# Patient Record
Sex: Male | Born: 1960 | Race: White | Hispanic: No
Health system: Southern US, Community
[De-identification: ages and names within clinical notes are randomized; demographics above are authoritative.]

---

## 1997-08-17 ENCOUNTER — Ambulatory Visit (HOSPITAL_BASED_OUTPATIENT_CLINIC_OR_DEPARTMENT_OTHER): Admission: RE | Admit: 1997-08-17 | Discharge: 1997-08-17 | Payer: Self-pay | Admitting: *Deleted

## 2004-02-22 ENCOUNTER — Ambulatory Visit: Payer: Self-pay | Admitting: Internal Medicine

## 2004-02-22 ENCOUNTER — Inpatient Hospital Stay (HOSPITAL_COMMUNITY): Admission: AD | Admit: 2004-02-22 | Discharge: 2004-03-07 | Payer: Self-pay | Admitting: Internal Medicine

## 2011-11-24 ENCOUNTER — Other Ambulatory Visit: Payer: Self-pay | Admitting: Family Medicine

## 2012-01-24 ENCOUNTER — Other Ambulatory Visit: Payer: Self-pay | Admitting: Family Medicine

## 2012-02-02 ENCOUNTER — Other Ambulatory Visit: Payer: Self-pay | Admitting: Family Medicine

## 2012-02-06 ENCOUNTER — Other Ambulatory Visit: Payer: Self-pay | Admitting: Family Medicine

## 2012-06-17 ENCOUNTER — Other Ambulatory Visit: Payer: Self-pay | Admitting: Family Medicine

## 2012-06-19 ENCOUNTER — Other Ambulatory Visit: Payer: Self-pay | Admitting: Family Medicine

## 2014-02-09 DIAGNOSIS — Z7901 Long term (current) use of anticoagulants: Secondary | ICD-10-CM | POA: Diagnosis not present

## 2014-02-23 DIAGNOSIS — Z7901 Long term (current) use of anticoagulants: Secondary | ICD-10-CM | POA: Diagnosis not present

## 2014-03-09 DIAGNOSIS — Z7901 Long term (current) use of anticoagulants: Secondary | ICD-10-CM | POA: Diagnosis not present

## 2014-03-23 DIAGNOSIS — Z791 Long term (current) use of non-steroidal anti-inflammatories (NSAID): Secondary | ICD-10-CM | POA: Diagnosis not present

## 2014-04-06 DIAGNOSIS — Z7901 Long term (current) use of anticoagulants: Secondary | ICD-10-CM | POA: Diagnosis not present

## 2014-04-27 DIAGNOSIS — I480 Paroxysmal atrial fibrillation: Secondary | ICD-10-CM | POA: Diagnosis not present

## 2014-05-11 DIAGNOSIS — Z7901 Long term (current) use of anticoagulants: Secondary | ICD-10-CM | POA: Diagnosis not present

## 2014-05-25 DIAGNOSIS — I480 Paroxysmal atrial fibrillation: Secondary | ICD-10-CM | POA: Diagnosis not present

## 2014-05-25 DIAGNOSIS — Z7901 Long term (current) use of anticoagulants: Secondary | ICD-10-CM | POA: Diagnosis not present

## 2014-06-22 DIAGNOSIS — I4891 Unspecified atrial fibrillation: Secondary | ICD-10-CM | POA: Diagnosis not present

## 2014-06-22 DIAGNOSIS — Z7901 Long term (current) use of anticoagulants: Secondary | ICD-10-CM | POA: Diagnosis not present

## 2014-07-20 DIAGNOSIS — I4891 Unspecified atrial fibrillation: Secondary | ICD-10-CM | POA: Diagnosis not present

## 2014-07-20 DIAGNOSIS — Z7901 Long term (current) use of anticoagulants: Secondary | ICD-10-CM | POA: Diagnosis not present

## 2014-08-17 DIAGNOSIS — Z7901 Long term (current) use of anticoagulants: Secondary | ICD-10-CM | POA: Diagnosis not present

## 2014-08-17 DIAGNOSIS — I4891 Unspecified atrial fibrillation: Secondary | ICD-10-CM | POA: Diagnosis not present

## 2014-09-14 DIAGNOSIS — I4891 Unspecified atrial fibrillation: Secondary | ICD-10-CM | POA: Diagnosis not present

## 2014-09-14 DIAGNOSIS — Z7901 Long term (current) use of anticoagulants: Secondary | ICD-10-CM | POA: Diagnosis not present

## 2014-10-16 DIAGNOSIS — I4891 Unspecified atrial fibrillation: Secondary | ICD-10-CM | POA: Diagnosis not present

## 2014-10-16 DIAGNOSIS — Z7901 Long term (current) use of anticoagulants: Secondary | ICD-10-CM | POA: Diagnosis not present

## 2014-10-23 DIAGNOSIS — I482 Chronic atrial fibrillation: Secondary | ICD-10-CM | POA: Diagnosis not present

## 2014-10-23 DIAGNOSIS — Z7901 Long term (current) use of anticoagulants: Secondary | ICD-10-CM | POA: Diagnosis not present

## 2014-11-06 DIAGNOSIS — Z7901 Long term (current) use of anticoagulants: Secondary | ICD-10-CM | POA: Diagnosis not present

## 2014-11-06 DIAGNOSIS — I4891 Unspecified atrial fibrillation: Secondary | ICD-10-CM | POA: Diagnosis not present

## 2014-12-04 DIAGNOSIS — Z7901 Long term (current) use of anticoagulants: Secondary | ICD-10-CM | POA: Diagnosis not present

## 2014-12-04 DIAGNOSIS — I4891 Unspecified atrial fibrillation: Secondary | ICD-10-CM | POA: Diagnosis not present

## 2015-01-01 DIAGNOSIS — Z7901 Long term (current) use of anticoagulants: Secondary | ICD-10-CM | POA: Diagnosis not present

## 2015-01-01 DIAGNOSIS — I4891 Unspecified atrial fibrillation: Secondary | ICD-10-CM | POA: Diagnosis not present

## 2015-01-16 DIAGNOSIS — Z7901 Long term (current) use of anticoagulants: Secondary | ICD-10-CM | POA: Diagnosis not present

## 2015-01-16 DIAGNOSIS — I509 Heart failure, unspecified: Secondary | ICD-10-CM | POA: Diagnosis not present

## 2015-01-16 DIAGNOSIS — I429 Cardiomyopathy, unspecified: Secondary | ICD-10-CM | POA: Diagnosis not present

## 2015-01-16 DIAGNOSIS — I4891 Unspecified atrial fibrillation: Secondary | ICD-10-CM | POA: Diagnosis not present

## 2015-01-16 DIAGNOSIS — E088 Diabetes mellitus due to underlying condition with unspecified complications: Secondary | ICD-10-CM | POA: Diagnosis not present

## 2015-01-16 DIAGNOSIS — F17210 Nicotine dependence, cigarettes, uncomplicated: Secondary | ICD-10-CM | POA: Diagnosis not present

## 2015-02-06 DIAGNOSIS — I4891 Unspecified atrial fibrillation: Secondary | ICD-10-CM | POA: Diagnosis not present

## 2015-02-06 DIAGNOSIS — I509 Heart failure, unspecified: Secondary | ICD-10-CM | POA: Diagnosis not present

## 2015-02-06 DIAGNOSIS — Z7901 Long term (current) use of anticoagulants: Secondary | ICD-10-CM | POA: Diagnosis not present

## 2015-02-06 DIAGNOSIS — F17210 Nicotine dependence, cigarettes, uncomplicated: Secondary | ICD-10-CM | POA: Diagnosis not present

## 2015-02-06 DIAGNOSIS — E088 Diabetes mellitus due to underlying condition with unspecified complications: Secondary | ICD-10-CM | POA: Diagnosis not present

## 2015-02-06 DIAGNOSIS — I429 Cardiomyopathy, unspecified: Secondary | ICD-10-CM | POA: Diagnosis not present

## 2015-03-02 DIAGNOSIS — Z7901 Long term (current) use of anticoagulants: Secondary | ICD-10-CM | POA: Diagnosis not present

## 2015-03-02 DIAGNOSIS — I4891 Unspecified atrial fibrillation: Secondary | ICD-10-CM | POA: Diagnosis not present

## 2015-04-03 DIAGNOSIS — I4891 Unspecified atrial fibrillation: Secondary | ICD-10-CM | POA: Diagnosis not present

## 2015-04-03 DIAGNOSIS — Z7901 Long term (current) use of anticoagulants: Secondary | ICD-10-CM | POA: Diagnosis not present

## 2015-05-01 DIAGNOSIS — I4891 Unspecified atrial fibrillation: Secondary | ICD-10-CM | POA: Diagnosis not present

## 2015-05-01 DIAGNOSIS — Z7901 Long term (current) use of anticoagulants: Secondary | ICD-10-CM | POA: Diagnosis not present

## 2015-05-15 DIAGNOSIS — I4891 Unspecified atrial fibrillation: Secondary | ICD-10-CM | POA: Diagnosis not present

## 2015-05-15 DIAGNOSIS — Z7901 Long term (current) use of anticoagulants: Secondary | ICD-10-CM | POA: Diagnosis not present

## 2015-05-29 DIAGNOSIS — I4891 Unspecified atrial fibrillation: Secondary | ICD-10-CM | POA: Diagnosis not present

## 2015-05-29 DIAGNOSIS — Z7901 Long term (current) use of anticoagulants: Secondary | ICD-10-CM | POA: Diagnosis not present

## 2015-06-18 DIAGNOSIS — M79671 Pain in right foot: Secondary | ICD-10-CM | POA: Diagnosis not present

## 2015-06-18 DIAGNOSIS — I739 Peripheral vascular disease, unspecified: Secondary | ICD-10-CM | POA: Diagnosis not present

## 2015-06-18 DIAGNOSIS — L97411 Non-pressure chronic ulcer of right heel and midfoot limited to breakdown of skin: Secondary | ICD-10-CM | POA: Diagnosis not present

## 2015-06-20 DIAGNOSIS — I739 Peripheral vascular disease, unspecified: Secondary | ICD-10-CM | POA: Diagnosis not present

## 2015-06-20 DIAGNOSIS — I4891 Unspecified atrial fibrillation: Secondary | ICD-10-CM | POA: Diagnosis not present

## 2015-06-21 DIAGNOSIS — I4891 Unspecified atrial fibrillation: Secondary | ICD-10-CM | POA: Diagnosis not present

## 2015-06-21 DIAGNOSIS — I739 Peripheral vascular disease, unspecified: Secondary | ICD-10-CM | POA: Diagnosis not present

## 2015-06-27 DIAGNOSIS — I482 Chronic atrial fibrillation: Secondary | ICD-10-CM | POA: Diagnosis not present

## 2015-06-27 DIAGNOSIS — E119 Type 2 diabetes mellitus without complications: Secondary | ICD-10-CM | POA: Diagnosis not present

## 2015-06-27 DIAGNOSIS — I739 Peripheral vascular disease, unspecified: Secondary | ICD-10-CM | POA: Diagnosis not present

## 2015-06-27 DIAGNOSIS — I255 Ischemic cardiomyopathy: Secondary | ICD-10-CM | POA: Diagnosis not present

## 2015-06-28 DIAGNOSIS — Z01818 Encounter for other preprocedural examination: Secondary | ICD-10-CM | POA: Diagnosis not present

## 2015-06-28 DIAGNOSIS — I509 Heart failure, unspecified: Secondary | ICD-10-CM | POA: Diagnosis present

## 2015-06-28 DIAGNOSIS — E876 Hypokalemia: Secondary | ICD-10-CM | POA: Diagnosis not present

## 2015-06-28 DIAGNOSIS — Z7901 Long term (current) use of anticoagulants: Secondary | ICD-10-CM | POA: Diagnosis not present

## 2015-06-28 DIAGNOSIS — E1151 Type 2 diabetes mellitus with diabetic peripheral angiopathy without gangrene: Secondary | ICD-10-CM | POA: Diagnosis present

## 2015-06-28 DIAGNOSIS — D62 Acute posthemorrhagic anemia: Secondary | ICD-10-CM | POA: Diagnosis not present

## 2015-06-28 DIAGNOSIS — F17210 Nicotine dependence, cigarettes, uncomplicated: Secondary | ICD-10-CM | POA: Diagnosis present

## 2015-06-28 DIAGNOSIS — Z79899 Other long term (current) drug therapy: Secondary | ICD-10-CM | POA: Diagnosis not present

## 2015-06-28 DIAGNOSIS — R079 Chest pain, unspecified: Secondary | ICD-10-CM | POA: Diagnosis not present

## 2015-06-28 DIAGNOSIS — I743 Embolism and thrombosis of arteries of the lower extremities: Secondary | ICD-10-CM | POA: Diagnosis not present

## 2015-06-28 DIAGNOSIS — I252 Old myocardial infarction: Secondary | ICD-10-CM | POA: Diagnosis not present

## 2015-06-28 DIAGNOSIS — I4891 Unspecified atrial fibrillation: Secondary | ICD-10-CM | POA: Diagnosis not present

## 2015-06-28 DIAGNOSIS — Z885 Allergy status to narcotic agent status: Secondary | ICD-10-CM | POA: Diagnosis not present

## 2015-06-28 DIAGNOSIS — I482 Chronic atrial fibrillation: Secondary | ICD-10-CM | POA: Diagnosis present

## 2015-06-28 DIAGNOSIS — R9431 Abnormal electrocardiogram [ECG] [EKG]: Secondary | ICD-10-CM | POA: Diagnosis not present

## 2015-06-28 DIAGNOSIS — L97419 Non-pressure chronic ulcer of right heel and midfoot with unspecified severity: Secondary | ICD-10-CM | POA: Diagnosis present

## 2015-06-28 DIAGNOSIS — I70221 Atherosclerosis of native arteries of extremities with rest pain, right leg: Secondary | ICD-10-CM | POA: Diagnosis present

## 2015-06-28 DIAGNOSIS — I739 Peripheral vascular disease, unspecified: Secondary | ICD-10-CM | POA: Diagnosis not present

## 2015-06-28 DIAGNOSIS — I9581 Postprocedural hypotension: Secondary | ICD-10-CM | POA: Diagnosis not present

## 2015-06-28 DIAGNOSIS — Z7982 Long term (current) use of aspirin: Secondary | ICD-10-CM | POA: Diagnosis not present

## 2015-06-28 DIAGNOSIS — I255 Ischemic cardiomyopathy: Secondary | ICD-10-CM | POA: Diagnosis present

## 2015-06-28 DIAGNOSIS — Z9889 Other specified postprocedural states: Secondary | ICD-10-CM | POA: Diagnosis not present

## 2015-06-28 DIAGNOSIS — T501X5A Adverse effect of loop [high-ceiling] diuretics, initial encounter: Secondary | ICD-10-CM | POA: Diagnosis not present

## 2015-06-28 DIAGNOSIS — D72829 Elevated white blood cell count, unspecified: Secondary | ICD-10-CM | POA: Diagnosis not present

## 2015-06-28 DIAGNOSIS — I429 Cardiomyopathy, unspecified: Secondary | ICD-10-CM | POA: Diagnosis not present

## 2015-06-28 DIAGNOSIS — I2510 Atherosclerotic heart disease of native coronary artery without angina pectoris: Secondary | ICD-10-CM | POA: Diagnosis present

## 2015-06-28 DIAGNOSIS — E11621 Type 2 diabetes mellitus with foot ulcer: Secondary | ICD-10-CM | POA: Diagnosis present

## 2015-06-28 DIAGNOSIS — J449 Chronic obstructive pulmonary disease, unspecified: Secondary | ICD-10-CM | POA: Diagnosis present

## 2015-06-28 DIAGNOSIS — Z7984 Long term (current) use of oral hypoglycemic drugs: Secondary | ICD-10-CM | POA: Diagnosis not present

## 2015-06-28 DIAGNOSIS — I7092 Chronic total occlusion of artery of the extremities: Secondary | ICD-10-CM | POA: Diagnosis present

## 2015-06-28 DIAGNOSIS — E785 Hyperlipidemia, unspecified: Secondary | ICD-10-CM | POA: Diagnosis present

## 2015-06-28 DIAGNOSIS — E119 Type 2 diabetes mellitus without complications: Secondary | ICD-10-CM | POA: Diagnosis present

## 2015-06-28 DIAGNOSIS — F17200 Nicotine dependence, unspecified, uncomplicated: Secondary | ICD-10-CM | POA: Diagnosis not present

## 2015-06-28 DIAGNOSIS — I5189 Other ill-defined heart diseases: Secondary | ICD-10-CM | POA: Diagnosis not present

## 2015-06-28 DIAGNOSIS — I998 Other disorder of circulatory system: Secondary | ICD-10-CM | POA: Diagnosis not present

## 2015-06-28 DIAGNOSIS — R0902 Hypoxemia: Secondary | ICD-10-CM | POA: Diagnosis not present

## 2015-06-28 DIAGNOSIS — Z794 Long term (current) use of insulin: Secondary | ICD-10-CM | POA: Diagnosis not present

## 2015-07-02 DIAGNOSIS — I998 Other disorder of circulatory system: Secondary | ICD-10-CM | POA: Diagnosis not present

## 2015-07-03 DIAGNOSIS — I739 Peripheral vascular disease, unspecified: Secondary | ICD-10-CM | POA: Diagnosis not present

## 2015-07-16 DIAGNOSIS — Z720 Tobacco use: Secondary | ICD-10-CM | POA: Diagnosis not present

## 2015-07-16 DIAGNOSIS — E1151 Type 2 diabetes mellitus with diabetic peripheral angiopathy without gangrene: Secondary | ICD-10-CM | POA: Diagnosis not present

## 2015-07-16 DIAGNOSIS — I255 Ischemic cardiomyopathy: Secondary | ICD-10-CM | POA: Diagnosis not present

## 2015-07-16 DIAGNOSIS — Z7901 Long term (current) use of anticoagulants: Secondary | ICD-10-CM | POA: Diagnosis not present

## 2015-07-16 DIAGNOSIS — I509 Heart failure, unspecified: Secondary | ICD-10-CM | POA: Diagnosis not present

## 2015-07-16 DIAGNOSIS — J449 Chronic obstructive pulmonary disease, unspecified: Secondary | ICD-10-CM | POA: Diagnosis not present

## 2015-07-16 DIAGNOSIS — I2510 Atherosclerotic heart disease of native coronary artery without angina pectoris: Secondary | ICD-10-CM | POA: Diagnosis not present

## 2015-07-16 DIAGNOSIS — L89610 Pressure ulcer of right heel, unstageable: Secondary | ICD-10-CM | POA: Diagnosis not present

## 2015-07-16 DIAGNOSIS — Z48812 Encounter for surgical aftercare following surgery on the circulatory system: Secondary | ICD-10-CM | POA: Diagnosis not present

## 2015-07-16 DIAGNOSIS — Z7984 Long term (current) use of oral hypoglycemic drugs: Secondary | ICD-10-CM | POA: Diagnosis not present

## 2015-07-16 DIAGNOSIS — I482 Chronic atrial fibrillation: Secondary | ICD-10-CM | POA: Diagnosis not present

## 2015-07-18 DIAGNOSIS — I739 Peripheral vascular disease, unspecified: Secondary | ICD-10-CM | POA: Diagnosis not present

## 2015-07-19 DIAGNOSIS — E1151 Type 2 diabetes mellitus with diabetic peripheral angiopathy without gangrene: Secondary | ICD-10-CM | POA: Diagnosis not present

## 2015-07-19 DIAGNOSIS — T8131XA Disruption of external operation (surgical) wound, not elsewhere classified, initial encounter: Secondary | ICD-10-CM | POA: Diagnosis present

## 2015-07-19 DIAGNOSIS — Z87891 Personal history of nicotine dependence: Secondary | ICD-10-CM | POA: Diagnosis not present

## 2015-07-19 DIAGNOSIS — I4891 Unspecified atrial fibrillation: Secondary | ICD-10-CM | POA: Diagnosis present

## 2015-07-19 DIAGNOSIS — I252 Old myocardial infarction: Secondary | ICD-10-CM | POA: Diagnosis not present

## 2015-07-19 DIAGNOSIS — I739 Peripheral vascular disease, unspecified: Secondary | ICD-10-CM | POA: Diagnosis present

## 2015-07-19 DIAGNOSIS — R197 Diarrhea, unspecified: Secondary | ICD-10-CM | POA: Diagnosis not present

## 2015-07-19 DIAGNOSIS — Z7982 Long term (current) use of aspirin: Secondary | ICD-10-CM | POA: Diagnosis not present

## 2015-07-19 DIAGNOSIS — Z8673 Personal history of transient ischemic attack (TIA), and cerebral infarction without residual deficits: Secondary | ICD-10-CM | POA: Diagnosis not present

## 2015-07-19 DIAGNOSIS — I429 Cardiomyopathy, unspecified: Secondary | ICD-10-CM | POA: Diagnosis present

## 2015-07-19 DIAGNOSIS — E11621 Type 2 diabetes mellitus with foot ulcer: Secondary | ICD-10-CM | POA: Diagnosis present

## 2015-07-19 DIAGNOSIS — B965 Pseudomonas (aeruginosa) (mallei) (pseudomallei) as the cause of diseases classified elsewhere: Secondary | ICD-10-CM | POA: Diagnosis present

## 2015-07-19 DIAGNOSIS — T814XXA Infection following a procedure, initial encounter: Secondary | ICD-10-CM | POA: Diagnosis present

## 2015-07-19 DIAGNOSIS — S31109D Unspecified open wound of abdominal wall, unspecified quadrant without penetration into peritoneal cavity, subsequent encounter: Secondary | ICD-10-CM | POA: Diagnosis not present

## 2015-07-19 DIAGNOSIS — I482 Chronic atrial fibrillation: Secondary | ICD-10-CM | POA: Diagnosis not present

## 2015-07-19 DIAGNOSIS — J449 Chronic obstructive pulmonary disease, unspecified: Secondary | ICD-10-CM | POA: Diagnosis present

## 2015-07-19 DIAGNOSIS — I2510 Atherosclerotic heart disease of native coronary artery without angina pectoris: Secondary | ICD-10-CM | POA: Diagnosis present

## 2015-07-19 DIAGNOSIS — L97411 Non-pressure chronic ulcer of right heel and midfoot limited to breakdown of skin: Secondary | ICD-10-CM | POA: Diagnosis present

## 2015-07-19 DIAGNOSIS — Z7984 Long term (current) use of oral hypoglycemic drugs: Secondary | ICD-10-CM | POA: Diagnosis not present

## 2015-07-19 DIAGNOSIS — Z48812 Encounter for surgical aftercare following surgery on the circulatory system: Secondary | ICD-10-CM | POA: Diagnosis not present

## 2015-07-19 DIAGNOSIS — I509 Heart failure, unspecified: Secondary | ICD-10-CM | POA: Diagnosis present

## 2015-07-19 DIAGNOSIS — L89610 Pressure ulcer of right heel, unstageable: Secondary | ICD-10-CM | POA: Diagnosis not present

## 2015-08-05 DIAGNOSIS — E1151 Type 2 diabetes mellitus with diabetic peripheral angiopathy without gangrene: Secondary | ICD-10-CM | POA: Diagnosis not present

## 2015-08-05 DIAGNOSIS — Z48812 Encounter for surgical aftercare following surgery on the circulatory system: Secondary | ICD-10-CM | POA: Diagnosis not present

## 2015-08-05 DIAGNOSIS — I509 Heart failure, unspecified: Secondary | ICD-10-CM | POA: Diagnosis not present

## 2015-08-05 DIAGNOSIS — I482 Chronic atrial fibrillation: Secondary | ICD-10-CM | POA: Diagnosis not present

## 2015-08-05 DIAGNOSIS — L89610 Pressure ulcer of right heel, unstageable: Secondary | ICD-10-CM | POA: Diagnosis not present

## 2015-08-05 DIAGNOSIS — I2510 Atherosclerotic heart disease of native coronary artery without angina pectoris: Secondary | ICD-10-CM | POA: Diagnosis not present

## 2015-08-06 DIAGNOSIS — I509 Heart failure, unspecified: Secondary | ICD-10-CM | POA: Diagnosis not present

## 2015-08-06 DIAGNOSIS — Z48812 Encounter for surgical aftercare following surgery on the circulatory system: Secondary | ICD-10-CM | POA: Diagnosis not present

## 2015-08-06 DIAGNOSIS — I482 Chronic atrial fibrillation: Secondary | ICD-10-CM | POA: Diagnosis not present

## 2015-08-06 DIAGNOSIS — L89610 Pressure ulcer of right heel, unstageable: Secondary | ICD-10-CM | POA: Diagnosis not present

## 2015-08-06 DIAGNOSIS — E1151 Type 2 diabetes mellitus with diabetic peripheral angiopathy without gangrene: Secondary | ICD-10-CM | POA: Diagnosis not present

## 2015-08-06 DIAGNOSIS — I2510 Atherosclerotic heart disease of native coronary artery without angina pectoris: Secondary | ICD-10-CM | POA: Diagnosis not present

## 2015-08-07 DIAGNOSIS — Z48812 Encounter for surgical aftercare following surgery on the circulatory system: Secondary | ICD-10-CM | POA: Diagnosis not present

## 2015-08-07 DIAGNOSIS — I2510 Atherosclerotic heart disease of native coronary artery without angina pectoris: Secondary | ICD-10-CM | POA: Diagnosis not present

## 2015-08-07 DIAGNOSIS — L89610 Pressure ulcer of right heel, unstageable: Secondary | ICD-10-CM | POA: Diagnosis not present

## 2015-08-07 DIAGNOSIS — I482 Chronic atrial fibrillation: Secondary | ICD-10-CM | POA: Diagnosis not present

## 2015-08-07 DIAGNOSIS — I509 Heart failure, unspecified: Secondary | ICD-10-CM | POA: Diagnosis not present

## 2015-08-07 DIAGNOSIS — E1151 Type 2 diabetes mellitus with diabetic peripheral angiopathy without gangrene: Secondary | ICD-10-CM | POA: Diagnosis not present

## 2015-08-10 DIAGNOSIS — I2510 Atherosclerotic heart disease of native coronary artery without angina pectoris: Secondary | ICD-10-CM | POA: Diagnosis not present

## 2015-08-10 DIAGNOSIS — E1151 Type 2 diabetes mellitus with diabetic peripheral angiopathy without gangrene: Secondary | ICD-10-CM | POA: Diagnosis not present

## 2015-08-10 DIAGNOSIS — Z48812 Encounter for surgical aftercare following surgery on the circulatory system: Secondary | ICD-10-CM | POA: Diagnosis not present

## 2015-08-10 DIAGNOSIS — L89610 Pressure ulcer of right heel, unstageable: Secondary | ICD-10-CM | POA: Diagnosis not present

## 2015-08-10 DIAGNOSIS — I482 Chronic atrial fibrillation: Secondary | ICD-10-CM | POA: Diagnosis not present

## 2015-08-10 DIAGNOSIS — I509 Heart failure, unspecified: Secondary | ICD-10-CM | POA: Diagnosis not present

## 2015-08-14 DIAGNOSIS — I482 Chronic atrial fibrillation: Secondary | ICD-10-CM | POA: Diagnosis not present

## 2015-08-14 DIAGNOSIS — E1151 Type 2 diabetes mellitus with diabetic peripheral angiopathy without gangrene: Secondary | ICD-10-CM | POA: Diagnosis not present

## 2015-08-14 DIAGNOSIS — I2510 Atherosclerotic heart disease of native coronary artery without angina pectoris: Secondary | ICD-10-CM | POA: Diagnosis not present

## 2015-08-14 DIAGNOSIS — Z48812 Encounter for surgical aftercare following surgery on the circulatory system: Secondary | ICD-10-CM | POA: Diagnosis not present

## 2015-08-14 DIAGNOSIS — L89610 Pressure ulcer of right heel, unstageable: Secondary | ICD-10-CM | POA: Diagnosis not present

## 2015-08-14 DIAGNOSIS — I509 Heart failure, unspecified: Secondary | ICD-10-CM | POA: Diagnosis not present

## 2015-08-16 DIAGNOSIS — L89610 Pressure ulcer of right heel, unstageable: Secondary | ICD-10-CM | POA: Diagnosis not present

## 2015-08-16 DIAGNOSIS — Z48812 Encounter for surgical aftercare following surgery on the circulatory system: Secondary | ICD-10-CM | POA: Diagnosis not present

## 2015-08-16 DIAGNOSIS — I482 Chronic atrial fibrillation: Secondary | ICD-10-CM | POA: Diagnosis not present

## 2015-08-16 DIAGNOSIS — I2510 Atherosclerotic heart disease of native coronary artery without angina pectoris: Secondary | ICD-10-CM | POA: Diagnosis not present

## 2015-08-16 DIAGNOSIS — I509 Heart failure, unspecified: Secondary | ICD-10-CM | POA: Diagnosis not present

## 2015-08-16 DIAGNOSIS — E1151 Type 2 diabetes mellitus with diabetic peripheral angiopathy without gangrene: Secondary | ICD-10-CM | POA: Diagnosis not present

## 2015-08-22 DIAGNOSIS — E1151 Type 2 diabetes mellitus with diabetic peripheral angiopathy without gangrene: Secondary | ICD-10-CM | POA: Diagnosis not present

## 2015-08-22 DIAGNOSIS — I482 Chronic atrial fibrillation: Secondary | ICD-10-CM | POA: Diagnosis not present

## 2015-08-22 DIAGNOSIS — I509 Heart failure, unspecified: Secondary | ICD-10-CM | POA: Diagnosis not present

## 2015-08-22 DIAGNOSIS — Z48812 Encounter for surgical aftercare following surgery on the circulatory system: Secondary | ICD-10-CM | POA: Diagnosis not present

## 2015-08-22 DIAGNOSIS — L89610 Pressure ulcer of right heel, unstageable: Secondary | ICD-10-CM | POA: Diagnosis not present

## 2015-08-22 DIAGNOSIS — I2510 Atherosclerotic heart disease of native coronary artery without angina pectoris: Secondary | ICD-10-CM | POA: Diagnosis not present

## 2015-08-30 DIAGNOSIS — I2510 Atherosclerotic heart disease of native coronary artery without angina pectoris: Secondary | ICD-10-CM | POA: Diagnosis not present

## 2015-08-30 DIAGNOSIS — E1151 Type 2 diabetes mellitus with diabetic peripheral angiopathy without gangrene: Secondary | ICD-10-CM | POA: Diagnosis not present

## 2015-08-30 DIAGNOSIS — I509 Heart failure, unspecified: Secondary | ICD-10-CM | POA: Diagnosis not present

## 2015-08-30 DIAGNOSIS — Z48812 Encounter for surgical aftercare following surgery on the circulatory system: Secondary | ICD-10-CM | POA: Diagnosis not present

## 2015-08-30 DIAGNOSIS — I482 Chronic atrial fibrillation: Secondary | ICD-10-CM | POA: Diagnosis not present

## 2015-08-30 DIAGNOSIS — L89610 Pressure ulcer of right heel, unstageable: Secondary | ICD-10-CM | POA: Diagnosis not present

## 2015-09-04 DIAGNOSIS — E1151 Type 2 diabetes mellitus with diabetic peripheral angiopathy without gangrene: Secondary | ICD-10-CM | POA: Diagnosis not present

## 2015-09-04 DIAGNOSIS — I509 Heart failure, unspecified: Secondary | ICD-10-CM | POA: Diagnosis not present

## 2015-09-04 DIAGNOSIS — I482 Chronic atrial fibrillation: Secondary | ICD-10-CM | POA: Diagnosis not present

## 2015-09-04 DIAGNOSIS — Z48812 Encounter for surgical aftercare following surgery on the circulatory system: Secondary | ICD-10-CM | POA: Diagnosis not present

## 2015-09-04 DIAGNOSIS — L89610 Pressure ulcer of right heel, unstageable: Secondary | ICD-10-CM | POA: Diagnosis not present

## 2015-09-04 DIAGNOSIS — I2510 Atherosclerotic heart disease of native coronary artery without angina pectoris: Secondary | ICD-10-CM | POA: Diagnosis not present

## 2015-09-24 DIAGNOSIS — Z95820 Peripheral vascular angioplasty status with implants and grafts: Secondary | ICD-10-CM | POA: Diagnosis not present

## 2015-09-24 DIAGNOSIS — I70201 Unspecified atherosclerosis of native arteries of extremities, right leg: Secondary | ICD-10-CM | POA: Diagnosis not present

## 2015-09-24 DIAGNOSIS — I700 Atherosclerosis of aorta: Secondary | ICD-10-CM | POA: Diagnosis not present

## 2015-09-24 DIAGNOSIS — I739 Peripheral vascular disease, unspecified: Secondary | ICD-10-CM | POA: Diagnosis not present

## 2015-09-24 DIAGNOSIS — I70209 Unspecified atherosclerosis of native arteries of extremities, unspecified extremity: Secondary | ICD-10-CM | POA: Diagnosis not present

## 2015-09-26 DIAGNOSIS — I4891 Unspecified atrial fibrillation: Secondary | ICD-10-CM | POA: Diagnosis not present

## 2015-09-26 DIAGNOSIS — Z7901 Long term (current) use of anticoagulants: Secondary | ICD-10-CM | POA: Diagnosis not present

## 2015-09-26 DIAGNOSIS — I482 Chronic atrial fibrillation: Secondary | ICD-10-CM | POA: Diagnosis not present

## 2015-10-03 DIAGNOSIS — Z7901 Long term (current) use of anticoagulants: Secondary | ICD-10-CM | POA: Diagnosis not present

## 2015-10-03 DIAGNOSIS — I482 Chronic atrial fibrillation: Secondary | ICD-10-CM | POA: Diagnosis not present

## 2015-10-17 DIAGNOSIS — Z7901 Long term (current) use of anticoagulants: Secondary | ICD-10-CM | POA: Diagnosis not present

## 2015-10-17 DIAGNOSIS — I482 Chronic atrial fibrillation: Secondary | ICD-10-CM | POA: Diagnosis not present

## 2015-10-19 DIAGNOSIS — R202 Paresthesia of skin: Secondary | ICD-10-CM | POA: Diagnosis not present

## 2015-10-19 DIAGNOSIS — M79671 Pain in right foot: Secondary | ICD-10-CM | POA: Diagnosis not present

## 2015-10-19 DIAGNOSIS — R200 Anesthesia of skin: Secondary | ICD-10-CM | POA: Diagnosis not present

## 2015-10-19 DIAGNOSIS — Z23 Encounter for immunization: Secondary | ICD-10-CM | POA: Diagnosis not present

## 2015-10-19 DIAGNOSIS — G5791 Unspecified mononeuropathy of right lower limb: Secondary | ICD-10-CM | POA: Diagnosis not present

## 2015-10-31 DIAGNOSIS — I482 Chronic atrial fibrillation: Secondary | ICD-10-CM | POA: Diagnosis not present

## 2015-10-31 DIAGNOSIS — Z7901 Long term (current) use of anticoagulants: Secondary | ICD-10-CM | POA: Diagnosis not present

## 2015-11-07 DIAGNOSIS — Z7901 Long term (current) use of anticoagulants: Secondary | ICD-10-CM | POA: Diagnosis not present

## 2015-11-07 DIAGNOSIS — I4891 Unspecified atrial fibrillation: Secondary | ICD-10-CM | POA: Diagnosis not present

## 2015-11-12 DIAGNOSIS — G5791 Unspecified mononeuropathy of right lower limb: Secondary | ICD-10-CM | POA: Diagnosis not present

## 2015-11-12 DIAGNOSIS — R200 Anesthesia of skin: Secondary | ICD-10-CM | POA: Diagnosis not present

## 2015-11-12 DIAGNOSIS — R202 Paresthesia of skin: Secondary | ICD-10-CM | POA: Diagnosis not present

## 2015-11-21 DIAGNOSIS — I482 Chronic atrial fibrillation: Secondary | ICD-10-CM | POA: Diagnosis not present

## 2015-11-21 DIAGNOSIS — Z7901 Long term (current) use of anticoagulants: Secondary | ICD-10-CM | POA: Diagnosis not present

## 2015-11-23 DIAGNOSIS — G629 Polyneuropathy, unspecified: Secondary | ICD-10-CM | POA: Diagnosis not present

## 2015-11-23 DIAGNOSIS — R202 Paresthesia of skin: Secondary | ICD-10-CM | POA: Diagnosis not present

## 2015-11-23 DIAGNOSIS — M79671 Pain in right foot: Secondary | ICD-10-CM | POA: Diagnosis not present

## 2015-11-23 DIAGNOSIS — R200 Anesthesia of skin: Secondary | ICD-10-CM | POA: Diagnosis not present

## 2015-11-23 DIAGNOSIS — G5791 Unspecified mononeuropathy of right lower limb: Secondary | ICD-10-CM | POA: Diagnosis not present

## 2015-11-29 DIAGNOSIS — Z7901 Long term (current) use of anticoagulants: Secondary | ICD-10-CM | POA: Diagnosis not present

## 2015-12-03 DIAGNOSIS — I255 Ischemic cardiomyopathy: Secondary | ICD-10-CM | POA: Diagnosis not present

## 2015-12-03 DIAGNOSIS — Z7901 Long term (current) use of anticoagulants: Secondary | ICD-10-CM | POA: Diagnosis not present

## 2015-12-03 DIAGNOSIS — J431 Panlobular emphysema: Secondary | ICD-10-CM | POA: Diagnosis not present

## 2015-12-03 DIAGNOSIS — I2510 Atherosclerotic heart disease of native coronary artery without angina pectoris: Secondary | ICD-10-CM | POA: Diagnosis not present

## 2015-12-03 DIAGNOSIS — I998 Other disorder of circulatory system: Secondary | ICD-10-CM | POA: Diagnosis not present

## 2015-12-03 DIAGNOSIS — I482 Chronic atrial fibrillation: Secondary | ICD-10-CM | POA: Diagnosis not present

## 2015-12-03 DIAGNOSIS — E088 Diabetes mellitus due to underlying condition with unspecified complications: Secondary | ICD-10-CM | POA: Diagnosis not present

## 2015-12-03 DIAGNOSIS — I739 Peripheral vascular disease, unspecified: Secondary | ICD-10-CM | POA: Diagnosis not present

## 2015-12-07 DIAGNOSIS — Z7901 Long term (current) use of anticoagulants: Secondary | ICD-10-CM | POA: Diagnosis not present

## 2015-12-07 DIAGNOSIS — I482 Chronic atrial fibrillation: Secondary | ICD-10-CM | POA: Diagnosis not present

## 2015-12-25 DIAGNOSIS — Z7901 Long term (current) use of anticoagulants: Secondary | ICD-10-CM | POA: Diagnosis not present

## 2015-12-25 DIAGNOSIS — I482 Chronic atrial fibrillation: Secondary | ICD-10-CM | POA: Diagnosis not present

## 2016-01-09 DIAGNOSIS — I70209 Unspecified atherosclerosis of native arteries of extremities, unspecified extremity: Secondary | ICD-10-CM | POA: Diagnosis not present

## 2016-01-09 DIAGNOSIS — Z95820 Peripheral vascular angioplasty status with implants and grafts: Secondary | ICD-10-CM | POA: Diagnosis not present

## 2016-01-09 DIAGNOSIS — I70201 Unspecified atherosclerosis of native arteries of extremities, right leg: Secondary | ICD-10-CM | POA: Diagnosis not present

## 2016-01-22 DIAGNOSIS — Z7901 Long term (current) use of anticoagulants: Secondary | ICD-10-CM | POA: Diagnosis not present

## 2016-01-22 DIAGNOSIS — I482 Chronic atrial fibrillation: Secondary | ICD-10-CM | POA: Diagnosis not present

## 2016-01-23 DIAGNOSIS — I739 Peripheral vascular disease, unspecified: Secondary | ICD-10-CM | POA: Diagnosis not present

## 2016-01-23 DIAGNOSIS — M79671 Pain in right foot: Secondary | ICD-10-CM | POA: Diagnosis not present

## 2016-01-23 DIAGNOSIS — I4891 Unspecified atrial fibrillation: Secondary | ICD-10-CM | POA: Diagnosis not present

## 2016-01-29 DIAGNOSIS — Z7901 Long term (current) use of anticoagulants: Secondary | ICD-10-CM | POA: Diagnosis not present

## 2016-01-29 DIAGNOSIS — I482 Chronic atrial fibrillation: Secondary | ICD-10-CM | POA: Diagnosis not present

## 2016-02-05 DIAGNOSIS — Z7901 Long term (current) use of anticoagulants: Secondary | ICD-10-CM | POA: Diagnosis not present

## 2016-02-05 DIAGNOSIS — I482 Chronic atrial fibrillation: Secondary | ICD-10-CM | POA: Diagnosis not present

## 2016-02-19 DIAGNOSIS — Z7901 Long term (current) use of anticoagulants: Secondary | ICD-10-CM | POA: Diagnosis not present

## 2016-02-19 DIAGNOSIS — I482 Chronic atrial fibrillation: Secondary | ICD-10-CM | POA: Diagnosis not present

## 2016-03-04 DIAGNOSIS — Z20828 Contact with and (suspected) exposure to other viral communicable diseases: Secondary | ICD-10-CM | POA: Diagnosis not present

## 2016-03-04 DIAGNOSIS — I480 Paroxysmal atrial fibrillation: Secondary | ICD-10-CM | POA: Diagnosis not present

## 2016-03-04 DIAGNOSIS — E1151 Type 2 diabetes mellitus with diabetic peripheral angiopathy without gangrene: Secondary | ICD-10-CM | POA: Diagnosis not present

## 2016-03-04 DIAGNOSIS — R945 Abnormal results of liver function studies: Secondary | ICD-10-CM | POA: Diagnosis not present

## 2016-03-04 DIAGNOSIS — E782 Mixed hyperlipidemia: Secondary | ICD-10-CM | POA: Diagnosis not present

## 2016-03-04 DIAGNOSIS — I110 Hypertensive heart disease with heart failure: Secondary | ICD-10-CM | POA: Diagnosis not present

## 2016-03-04 DIAGNOSIS — R300 Dysuria: Secondary | ICD-10-CM | POA: Diagnosis not present

## 2016-03-04 DIAGNOSIS — E1142 Type 2 diabetes mellitus with diabetic polyneuropathy: Secondary | ICD-10-CM | POA: Diagnosis not present

## 2016-03-10 DIAGNOSIS — Z20828 Contact with and (suspected) exposure to other viral communicable diseases: Secondary | ICD-10-CM | POA: Diagnosis not present

## 2016-03-10 DIAGNOSIS — B1920 Unspecified viral hepatitis C without hepatic coma: Secondary | ICD-10-CM | POA: Diagnosis not present

## 2016-03-18 DIAGNOSIS — I482 Chronic atrial fibrillation: Secondary | ICD-10-CM | POA: Diagnosis not present

## 2016-03-18 DIAGNOSIS — Z7901 Long term (current) use of anticoagulants: Secondary | ICD-10-CM | POA: Diagnosis not present

## 2016-04-14 DIAGNOSIS — E1142 Type 2 diabetes mellitus with diabetic polyneuropathy: Secondary | ICD-10-CM | POA: Diagnosis not present

## 2016-04-14 DIAGNOSIS — Z7901 Long term (current) use of anticoagulants: Secondary | ICD-10-CM | POA: Diagnosis not present

## 2016-04-14 DIAGNOSIS — K921 Melena: Secondary | ICD-10-CM | POA: Diagnosis not present

## 2016-04-14 DIAGNOSIS — Z0001 Encounter for general adult medical examination with abnormal findings: Secondary | ICD-10-CM | POA: Diagnosis not present

## 2016-04-14 DIAGNOSIS — Z6822 Body mass index (BMI) 22.0-22.9, adult: Secondary | ICD-10-CM | POA: Diagnosis not present

## 2016-04-14 DIAGNOSIS — Z1211 Encounter for screening for malignant neoplasm of colon: Secondary | ICD-10-CM | POA: Diagnosis not present

## 2016-04-18 DIAGNOSIS — Z7901 Long term (current) use of anticoagulants: Secondary | ICD-10-CM | POA: Diagnosis not present

## 2016-04-18 DIAGNOSIS — I482 Chronic atrial fibrillation: Secondary | ICD-10-CM | POA: Diagnosis not present

## 2016-04-22 DIAGNOSIS — R200 Anesthesia of skin: Secondary | ICD-10-CM | POA: Diagnosis not present

## 2016-04-22 DIAGNOSIS — G629 Polyneuropathy, unspecified: Secondary | ICD-10-CM | POA: Diagnosis not present

## 2016-04-22 DIAGNOSIS — G5791 Unspecified mononeuropathy of right lower limb: Secondary | ICD-10-CM | POA: Diagnosis not present

## 2016-04-22 DIAGNOSIS — R202 Paresthesia of skin: Secondary | ICD-10-CM | POA: Diagnosis not present

## 2016-05-01 DIAGNOSIS — Z1211 Encounter for screening for malignant neoplasm of colon: Secondary | ICD-10-CM | POA: Diagnosis not present

## 2016-05-01 DIAGNOSIS — Z7901 Long term (current) use of anticoagulants: Secondary | ICD-10-CM | POA: Diagnosis not present

## 2016-05-01 DIAGNOSIS — K921 Melena: Secondary | ICD-10-CM | POA: Diagnosis not present

## 2016-05-01 DIAGNOSIS — I482 Chronic atrial fibrillation: Secondary | ICD-10-CM | POA: Diagnosis not present

## 2016-05-05 DIAGNOSIS — I700 Atherosclerosis of aorta: Secondary | ICD-10-CM | POA: Diagnosis not present

## 2016-05-05 DIAGNOSIS — Z95820 Peripheral vascular angioplasty status with implants and grafts: Secondary | ICD-10-CM | POA: Diagnosis not present

## 2016-05-05 DIAGNOSIS — I70201 Unspecified atherosclerosis of native arteries of extremities, right leg: Secondary | ICD-10-CM | POA: Diagnosis not present

## 2016-05-15 DIAGNOSIS — I482 Chronic atrial fibrillation: Secondary | ICD-10-CM | POA: Diagnosis not present

## 2016-05-15 DIAGNOSIS — Z7901 Long term (current) use of anticoagulants: Secondary | ICD-10-CM | POA: Diagnosis not present

## 2016-06-02 DIAGNOSIS — I739 Peripheral vascular disease, unspecified: Secondary | ICD-10-CM | POA: Diagnosis not present

## 2016-06-02 DIAGNOSIS — I509 Heart failure, unspecified: Secondary | ICD-10-CM | POA: Diagnosis not present

## 2016-06-02 DIAGNOSIS — I255 Ischemic cardiomyopathy: Secondary | ICD-10-CM | POA: Diagnosis not present

## 2016-06-02 DIAGNOSIS — E088 Diabetes mellitus due to underlying condition with unspecified complications: Secondary | ICD-10-CM | POA: Diagnosis not present

## 2016-06-02 DIAGNOSIS — I482 Chronic atrial fibrillation: Secondary | ICD-10-CM | POA: Diagnosis not present

## 2016-06-02 DIAGNOSIS — J431 Panlobular emphysema: Secondary | ICD-10-CM | POA: Diagnosis not present

## 2016-06-02 DIAGNOSIS — I2510 Atherosclerotic heart disease of native coronary artery without angina pectoris: Secondary | ICD-10-CM | POA: Diagnosis not present

## 2016-06-02 DIAGNOSIS — Z7901 Long term (current) use of anticoagulants: Secondary | ICD-10-CM | POA: Diagnosis not present

## 2016-06-09 DIAGNOSIS — J431 Panlobular emphysema: Secondary | ICD-10-CM | POA: Diagnosis not present

## 2016-06-09 DIAGNOSIS — I509 Heart failure, unspecified: Secondary | ICD-10-CM | POA: Diagnosis not present

## 2016-06-09 DIAGNOSIS — I2510 Atherosclerotic heart disease of native coronary artery without angina pectoris: Secondary | ICD-10-CM | POA: Diagnosis not present

## 2016-06-09 DIAGNOSIS — I739 Peripheral vascular disease, unspecified: Secondary | ICD-10-CM | POA: Diagnosis not present

## 2016-06-09 DIAGNOSIS — Z7901 Long term (current) use of anticoagulants: Secondary | ICD-10-CM | POA: Diagnosis not present

## 2016-06-09 DIAGNOSIS — I255 Ischemic cardiomyopathy: Secondary | ICD-10-CM | POA: Diagnosis not present

## 2016-06-09 DIAGNOSIS — I482 Chronic atrial fibrillation: Secondary | ICD-10-CM | POA: Diagnosis not present

## 2016-06-09 DIAGNOSIS — E088 Diabetes mellitus due to underlying condition with unspecified complications: Secondary | ICD-10-CM | POA: Diagnosis not present

## 2016-06-10 DIAGNOSIS — E1142 Type 2 diabetes mellitus with diabetic polyneuropathy: Secondary | ICD-10-CM | POA: Diagnosis not present

## 2016-06-10 DIAGNOSIS — I110 Hypertensive heart disease with heart failure: Secondary | ICD-10-CM | POA: Diagnosis not present

## 2016-06-10 DIAGNOSIS — E782 Mixed hyperlipidemia: Secondary | ICD-10-CM | POA: Diagnosis not present

## 2016-06-10 DIAGNOSIS — I480 Paroxysmal atrial fibrillation: Secondary | ICD-10-CM | POA: Diagnosis not present

## 2016-06-10 DIAGNOSIS — I10 Essential (primary) hypertension: Secondary | ICD-10-CM | POA: Diagnosis not present

## 2016-06-12 DIAGNOSIS — Z7901 Long term (current) use of anticoagulants: Secondary | ICD-10-CM | POA: Diagnosis not present

## 2016-06-12 DIAGNOSIS — I482 Chronic atrial fibrillation: Secondary | ICD-10-CM | POA: Diagnosis not present

## 2016-07-10 DIAGNOSIS — I482 Chronic atrial fibrillation: Secondary | ICD-10-CM | POA: Diagnosis not present

## 2016-07-10 DIAGNOSIS — Z7901 Long term (current) use of anticoagulants: Secondary | ICD-10-CM | POA: Diagnosis not present

## 2016-07-22 DIAGNOSIS — I4891 Unspecified atrial fibrillation: Secondary | ICD-10-CM | POA: Diagnosis not present

## 2016-07-22 DIAGNOSIS — G5791 Unspecified mononeuropathy of right lower limb: Secondary | ICD-10-CM | POA: Diagnosis not present

## 2016-07-22 DIAGNOSIS — I739 Peripheral vascular disease, unspecified: Secondary | ICD-10-CM | POA: Diagnosis not present

## 2016-08-20 DIAGNOSIS — Z7901 Long term (current) use of anticoagulants: Secondary | ICD-10-CM | POA: Diagnosis not present

## 2016-08-20 DIAGNOSIS — I482 Chronic atrial fibrillation: Secondary | ICD-10-CM | POA: Diagnosis not present

## 2016-09-11 DIAGNOSIS — E782 Mixed hyperlipidemia: Secondary | ICD-10-CM | POA: Diagnosis not present

## 2016-09-11 DIAGNOSIS — I10 Essential (primary) hypertension: Secondary | ICD-10-CM | POA: Diagnosis not present

## 2016-09-11 DIAGNOSIS — E1142 Type 2 diabetes mellitus with diabetic polyneuropathy: Secondary | ICD-10-CM | POA: Diagnosis not present

## 2016-09-11 DIAGNOSIS — I480 Paroxysmal atrial fibrillation: Secondary | ICD-10-CM | POA: Diagnosis not present

## 2016-09-11 DIAGNOSIS — I110 Hypertensive heart disease with heart failure: Secondary | ICD-10-CM | POA: Diagnosis not present

## 2016-09-16 DIAGNOSIS — I482 Chronic atrial fibrillation: Secondary | ICD-10-CM | POA: Diagnosis not present

## 2016-09-16 DIAGNOSIS — Z7901 Long term (current) use of anticoagulants: Secondary | ICD-10-CM | POA: Diagnosis not present

## 2016-10-02 DIAGNOSIS — I119 Hypertensive heart disease without heart failure: Secondary | ICD-10-CM | POA: Diagnosis not present

## 2016-10-14 DIAGNOSIS — I482 Chronic atrial fibrillation: Secondary | ICD-10-CM | POA: Diagnosis not present

## 2016-10-14 DIAGNOSIS — Z7901 Long term (current) use of anticoagulants: Secondary | ICD-10-CM | POA: Diagnosis not present

## 2016-10-20 DIAGNOSIS — I482 Chronic atrial fibrillation: Secondary | ICD-10-CM | POA: Diagnosis not present

## 2016-10-20 DIAGNOSIS — Z7901 Long term (current) use of anticoagulants: Secondary | ICD-10-CM | POA: Diagnosis not present

## 2016-10-23 DIAGNOSIS — H53462 Homonymous bilateral field defects, left side: Secondary | ICD-10-CM | POA: Diagnosis not present

## 2016-10-23 DIAGNOSIS — I252 Old myocardial infarction: Secondary | ICD-10-CM | POA: Diagnosis not present

## 2016-10-23 DIAGNOSIS — I480 Paroxysmal atrial fibrillation: Secondary | ICD-10-CM | POA: Diagnosis not present

## 2016-10-23 DIAGNOSIS — I6523 Occlusion and stenosis of bilateral carotid arteries: Secondary | ICD-10-CM | POA: Diagnosis not present

## 2016-10-23 DIAGNOSIS — I63233 Cerebral infarction due to unspecified occlusion or stenosis of bilateral carotid arteries: Secondary | ICD-10-CM | POA: Diagnosis not present

## 2016-10-23 DIAGNOSIS — I509 Heart failure, unspecified: Secondary | ICD-10-CM | POA: Diagnosis not present

## 2016-10-23 DIAGNOSIS — H538 Other visual disturbances: Secondary | ICD-10-CM | POA: Diagnosis not present

## 2016-10-23 DIAGNOSIS — H547 Unspecified visual loss: Secondary | ICD-10-CM | POA: Diagnosis not present

## 2016-10-23 DIAGNOSIS — Z8673 Personal history of transient ischemic attack (TIA), and cerebral infarction without residual deficits: Secondary | ICD-10-CM | POA: Diagnosis not present

## 2016-10-23 DIAGNOSIS — Z79899 Other long term (current) drug therapy: Secondary | ICD-10-CM | POA: Diagnosis not present

## 2016-10-23 DIAGNOSIS — I959 Hypotension, unspecified: Secondary | ICD-10-CM | POA: Diagnosis not present

## 2016-10-23 DIAGNOSIS — E785 Hyperlipidemia, unspecified: Secondary | ICD-10-CM | POA: Diagnosis not present

## 2016-10-23 DIAGNOSIS — I481 Persistent atrial fibrillation: Secondary | ICD-10-CM | POA: Diagnosis not present

## 2016-10-23 DIAGNOSIS — E7800 Pure hypercholesterolemia, unspecified: Secondary | ICD-10-CM | POA: Diagnosis not present

## 2016-10-23 DIAGNOSIS — I639 Cerebral infarction, unspecified: Secondary | ICD-10-CM | POA: Diagnosis not present

## 2016-10-23 DIAGNOSIS — J449 Chronic obstructive pulmonary disease, unspecified: Secondary | ICD-10-CM | POA: Diagnosis not present

## 2016-10-23 DIAGNOSIS — D638 Anemia in other chronic diseases classified elsewhere: Secondary | ICD-10-CM | POA: Diagnosis not present

## 2016-10-23 DIAGNOSIS — Z7901 Long term (current) use of anticoagulants: Secondary | ICD-10-CM | POA: Diagnosis not present

## 2016-10-23 DIAGNOSIS — I110 Hypertensive heart disease with heart failure: Secondary | ICD-10-CM | POA: Diagnosis not present

## 2016-10-23 DIAGNOSIS — Z885 Allergy status to narcotic agent status: Secondary | ICD-10-CM | POA: Diagnosis not present

## 2016-10-23 DIAGNOSIS — I429 Cardiomyopathy, unspecified: Secondary | ICD-10-CM | POA: Diagnosis not present

## 2016-10-23 DIAGNOSIS — F17210 Nicotine dependence, cigarettes, uncomplicated: Secondary | ICD-10-CM | POA: Diagnosis not present

## 2016-10-23 DIAGNOSIS — Z23 Encounter for immunization: Secondary | ICD-10-CM | POA: Diagnosis not present

## 2016-10-23 DIAGNOSIS — Z888 Allergy status to other drugs, medicaments and biological substances status: Secondary | ICD-10-CM | POA: Diagnosis not present

## 2016-10-23 DIAGNOSIS — I10 Essential (primary) hypertension: Secondary | ICD-10-CM | POA: Diagnosis not present

## 2016-10-23 DIAGNOSIS — I4891 Unspecified atrial fibrillation: Secondary | ICD-10-CM | POA: Diagnosis not present

## 2016-10-23 DIAGNOSIS — I2510 Atherosclerotic heart disease of native coronary artery without angina pectoris: Secondary | ICD-10-CM | POA: Diagnosis not present

## 2016-10-23 DIAGNOSIS — R29701 NIHSS score 1: Secondary | ICD-10-CM | POA: Diagnosis not present

## 2016-10-24 DIAGNOSIS — J449 Chronic obstructive pulmonary disease, unspecified: Secondary | ICD-10-CM | POA: Diagnosis not present

## 2016-10-24 DIAGNOSIS — H53462 Homonymous bilateral field defects, left side: Secondary | ICD-10-CM | POA: Diagnosis not present

## 2016-10-24 DIAGNOSIS — I429 Cardiomyopathy, unspecified: Secondary | ICD-10-CM | POA: Diagnosis not present

## 2016-10-24 DIAGNOSIS — D638 Anemia in other chronic diseases classified elsewhere: Secondary | ICD-10-CM | POA: Diagnosis not present

## 2016-10-24 DIAGNOSIS — I639 Cerebral infarction, unspecified: Secondary | ICD-10-CM | POA: Diagnosis not present

## 2016-10-24 DIAGNOSIS — I10 Essential (primary) hypertension: Secondary | ICD-10-CM | POA: Diagnosis not present

## 2016-10-24 DIAGNOSIS — I2510 Atherosclerotic heart disease of native coronary artery without angina pectoris: Secondary | ICD-10-CM | POA: Diagnosis not present

## 2016-10-24 DIAGNOSIS — I4891 Unspecified atrial fibrillation: Secondary | ICD-10-CM | POA: Diagnosis not present

## 2016-10-24 DIAGNOSIS — I6789 Other cerebrovascular disease: Secondary | ICD-10-CM | POA: Diagnosis not present

## 2016-10-27 DIAGNOSIS — I482 Chronic atrial fibrillation: Secondary | ICD-10-CM | POA: Diagnosis not present

## 2016-10-27 DIAGNOSIS — Z7901 Long term (current) use of anticoagulants: Secondary | ICD-10-CM | POA: Diagnosis not present

## 2016-11-06 DIAGNOSIS — E1142 Type 2 diabetes mellitus with diabetic polyneuropathy: Secondary | ICD-10-CM | POA: Diagnosis not present

## 2016-11-06 DIAGNOSIS — H53462 Homonymous bilateral field defects, left side: Secondary | ICD-10-CM | POA: Diagnosis not present

## 2016-11-06 DIAGNOSIS — I63111 Cerebral infarction due to embolism of right vertebral artery: Secondary | ICD-10-CM | POA: Diagnosis not present

## 2016-11-10 DIAGNOSIS — Z7901 Long term (current) use of anticoagulants: Secondary | ICD-10-CM | POA: Diagnosis not present

## 2016-11-10 DIAGNOSIS — I482 Chronic atrial fibrillation: Secondary | ICD-10-CM | POA: Diagnosis not present

## 2016-12-02 DIAGNOSIS — I70203 Unspecified atherosclerosis of native arteries of extremities, bilateral legs: Secondary | ICD-10-CM | POA: Diagnosis not present

## 2016-12-02 DIAGNOSIS — Z95820 Peripheral vascular angioplasty status with implants and grafts: Secondary | ICD-10-CM | POA: Diagnosis not present

## 2016-12-02 DIAGNOSIS — I700 Atherosclerosis of aorta: Secondary | ICD-10-CM | POA: Diagnosis not present

## 2016-12-03 DIAGNOSIS — J449 Chronic obstructive pulmonary disease, unspecified: Secondary | ICD-10-CM | POA: Diagnosis not present

## 2016-12-03 DIAGNOSIS — Z9861 Coronary angioplasty status: Secondary | ICD-10-CM | POA: Diagnosis not present

## 2016-12-03 DIAGNOSIS — Z87891 Personal history of nicotine dependence: Secondary | ICD-10-CM | POA: Diagnosis not present

## 2016-12-03 DIAGNOSIS — I509 Heart failure, unspecified: Secondary | ICD-10-CM | POA: Diagnosis not present

## 2016-12-03 DIAGNOSIS — I739 Peripheral vascular disease, unspecified: Secondary | ICD-10-CM | POA: Diagnosis not present

## 2016-12-03 DIAGNOSIS — I4891 Unspecified atrial fibrillation: Secondary | ICD-10-CM | POA: Diagnosis not present

## 2016-12-03 DIAGNOSIS — Z7901 Long term (current) use of anticoagulants: Secondary | ICD-10-CM | POA: Diagnosis not present

## 2016-12-03 DIAGNOSIS — Z7984 Long term (current) use of oral hypoglycemic drugs: Secondary | ICD-10-CM | POA: Diagnosis not present

## 2016-12-03 DIAGNOSIS — E119 Type 2 diabetes mellitus without complications: Secondary | ICD-10-CM | POA: Diagnosis not present

## 2016-12-03 DIAGNOSIS — Z8673 Personal history of transient ischemic attack (TIA), and cerebral infarction without residual deficits: Secondary | ICD-10-CM | POA: Diagnosis not present

## 2016-12-03 DIAGNOSIS — D696 Thrombocytopenia, unspecified: Secondary | ICD-10-CM | POA: Diagnosis not present

## 2016-12-03 DIAGNOSIS — I429 Cardiomyopathy, unspecified: Secondary | ICD-10-CM | POA: Diagnosis not present

## 2016-12-08 DIAGNOSIS — Z7901 Long term (current) use of anticoagulants: Secondary | ICD-10-CM | POA: Diagnosis not present

## 2016-12-08 DIAGNOSIS — I482 Chronic atrial fibrillation: Secondary | ICD-10-CM | POA: Diagnosis not present

## 2016-12-11 DIAGNOSIS — I7092 Chronic total occlusion of artery of the extremities: Secondary | ICD-10-CM | POA: Diagnosis not present

## 2016-12-11 DIAGNOSIS — I739 Peripheral vascular disease, unspecified: Secondary | ICD-10-CM | POA: Diagnosis not present

## 2016-12-17 DIAGNOSIS — Z7982 Long term (current) use of aspirin: Secondary | ICD-10-CM | POA: Diagnosis not present

## 2016-12-17 DIAGNOSIS — Z95820 Peripheral vascular angioplasty status with implants and grafts: Secondary | ICD-10-CM | POA: Diagnosis not present

## 2016-12-17 DIAGNOSIS — E119 Type 2 diabetes mellitus without complications: Secondary | ICD-10-CM | POA: Diagnosis not present

## 2016-12-17 DIAGNOSIS — I509 Heart failure, unspecified: Secondary | ICD-10-CM | POA: Diagnosis not present

## 2016-12-17 DIAGNOSIS — I700 Atherosclerosis of aorta: Secondary | ICD-10-CM | POA: Diagnosis not present

## 2016-12-17 DIAGNOSIS — Z8673 Personal history of transient ischemic attack (TIA), and cerebral infarction without residual deficits: Secondary | ICD-10-CM | POA: Diagnosis not present

## 2016-12-17 DIAGNOSIS — D696 Thrombocytopenia, unspecified: Secondary | ICD-10-CM | POA: Diagnosis not present

## 2016-12-17 DIAGNOSIS — I4891 Unspecified atrial fibrillation: Secondary | ICD-10-CM | POA: Diagnosis not present

## 2016-12-17 DIAGNOSIS — Z87891 Personal history of nicotine dependence: Secondary | ICD-10-CM | POA: Diagnosis not present

## 2016-12-17 DIAGNOSIS — J449 Chronic obstructive pulmonary disease, unspecified: Secondary | ICD-10-CM | POA: Diagnosis not present

## 2016-12-17 DIAGNOSIS — Z7901 Long term (current) use of anticoagulants: Secondary | ICD-10-CM | POA: Diagnosis not present

## 2016-12-17 DIAGNOSIS — I739 Peripheral vascular disease, unspecified: Secondary | ICD-10-CM | POA: Diagnosis not present

## 2016-12-18 ENCOUNTER — Telehealth: Payer: Self-pay

## 2016-12-18 NOTE — Telephone Encounter (Signed)
Called patient to set establishing patient appointment. Patient would like to think about transferring his care and then call back.

## 2017-01-01 DIAGNOSIS — Z7984 Long term (current) use of oral hypoglycemic drugs: Secondary | ICD-10-CM | POA: Diagnosis not present

## 2017-01-01 DIAGNOSIS — I700 Atherosclerosis of aorta: Secondary | ICD-10-CM | POA: Diagnosis not present

## 2017-01-01 DIAGNOSIS — Z7982 Long term (current) use of aspirin: Secondary | ICD-10-CM | POA: Diagnosis not present

## 2017-01-01 DIAGNOSIS — T82898A Other specified complication of vascular prosthetic devices, implants and grafts, initial encounter: Secondary | ICD-10-CM | POA: Diagnosis present

## 2017-01-01 DIAGNOSIS — E1151 Type 2 diabetes mellitus with diabetic peripheral angiopathy without gangrene: Secondary | ICD-10-CM | POA: Diagnosis present

## 2017-01-01 DIAGNOSIS — Z833 Family history of diabetes mellitus: Secondary | ICD-10-CM | POA: Diagnosis not present

## 2017-01-01 DIAGNOSIS — J449 Chronic obstructive pulmonary disease, unspecified: Secondary | ICD-10-CM | POA: Diagnosis present

## 2017-01-01 DIAGNOSIS — L97419 Non-pressure chronic ulcer of right heel and midfoot with unspecified severity: Secondary | ICD-10-CM | POA: Diagnosis present

## 2017-01-01 DIAGNOSIS — I998 Other disorder of circulatory system: Secondary | ICD-10-CM | POA: Diagnosis not present

## 2017-01-01 DIAGNOSIS — I4891 Unspecified atrial fibrillation: Secondary | ICD-10-CM | POA: Diagnosis present

## 2017-01-01 DIAGNOSIS — E785 Hyperlipidemia, unspecified: Secondary | ICD-10-CM | POA: Diagnosis present

## 2017-01-01 DIAGNOSIS — Z8249 Family history of ischemic heart disease and other diseases of the circulatory system: Secondary | ICD-10-CM | POA: Diagnosis not present

## 2017-01-01 DIAGNOSIS — R000 Tachycardia, unspecified: Secondary | ICD-10-CM | POA: Diagnosis not present

## 2017-01-01 DIAGNOSIS — Z823 Family history of stroke: Secondary | ICD-10-CM | POA: Diagnosis not present

## 2017-01-01 DIAGNOSIS — Z95828 Presence of other vascular implants and grafts: Secondary | ICD-10-CM | POA: Diagnosis not present

## 2017-01-01 DIAGNOSIS — Z7901 Long term (current) use of anticoagulants: Secondary | ICD-10-CM | POA: Diagnosis not present

## 2017-01-01 DIAGNOSIS — Z9861 Coronary angioplasty status: Secondary | ICD-10-CM | POA: Diagnosis not present

## 2017-01-01 DIAGNOSIS — E11621 Type 2 diabetes mellitus with foot ulcer: Secondary | ICD-10-CM | POA: Diagnosis present

## 2017-01-01 DIAGNOSIS — F17200 Nicotine dependence, unspecified, uncomplicated: Secondary | ICD-10-CM | POA: Diagnosis not present

## 2017-01-01 DIAGNOSIS — Z820 Family history of epilepsy and other diseases of the nervous system: Secondary | ICD-10-CM | POA: Diagnosis not present

## 2017-01-01 DIAGNOSIS — Z8673 Personal history of transient ischemic attack (TIA), and cerebral infarction without residual deficits: Secondary | ICD-10-CM | POA: Diagnosis not present

## 2017-01-01 DIAGNOSIS — I739 Peripheral vascular disease, unspecified: Secondary | ICD-10-CM | POA: Diagnosis not present

## 2017-01-01 DIAGNOSIS — I429 Cardiomyopathy, unspecified: Secondary | ICD-10-CM | POA: Diagnosis present

## 2017-01-01 DIAGNOSIS — I639 Cerebral infarction, unspecified: Secondary | ICD-10-CM | POA: Diagnosis not present

## 2017-01-01 DIAGNOSIS — I482 Chronic atrial fibrillation: Secondary | ICD-10-CM | POA: Diagnosis not present

## 2017-01-01 DIAGNOSIS — I708 Atherosclerosis of other arteries: Secondary | ICD-10-CM | POA: Diagnosis not present

## 2017-01-01 DIAGNOSIS — K432 Incisional hernia without obstruction or gangrene: Secondary | ICD-10-CM | POA: Diagnosis present

## 2017-01-01 DIAGNOSIS — I745 Embolism and thrombosis of iliac artery: Secondary | ICD-10-CM | POA: Diagnosis present

## 2017-01-01 DIAGNOSIS — I509 Heart failure, unspecified: Secondary | ICD-10-CM | POA: Diagnosis present

## 2017-01-01 DIAGNOSIS — I2510 Atherosclerotic heart disease of native coronary artery without angina pectoris: Secondary | ICD-10-CM | POA: Diagnosis present

## 2017-01-01 DIAGNOSIS — E119 Type 2 diabetes mellitus without complications: Secondary | ICD-10-CM | POA: Diagnosis not present

## 2017-01-01 DIAGNOSIS — T82818A Embolism of vascular prosthetic devices, implants and grafts, initial encounter: Secondary | ICD-10-CM | POA: Diagnosis present

## 2017-01-01 DIAGNOSIS — K660 Peritoneal adhesions (postprocedural) (postinfection): Secondary | ICD-10-CM | POA: Diagnosis not present

## 2017-01-01 DIAGNOSIS — L97411 Non-pressure chronic ulcer of right heel and midfoot limited to breakdown of skin: Secondary | ICD-10-CM | POA: Diagnosis not present

## 2017-01-07 DIAGNOSIS — Z7901 Long term (current) use of anticoagulants: Secondary | ICD-10-CM | POA: Diagnosis not present

## 2017-01-07 DIAGNOSIS — I482 Chronic atrial fibrillation: Secondary | ICD-10-CM | POA: Diagnosis not present

## 2017-01-13 DIAGNOSIS — R109 Unspecified abdominal pain: Secondary | ICD-10-CM | POA: Diagnosis not present

## 2017-01-13 DIAGNOSIS — R1110 Vomiting, unspecified: Secondary | ICD-10-CM | POA: Diagnosis not present

## 2017-01-13 DIAGNOSIS — I739 Peripheral vascular disease, unspecified: Secondary | ICD-10-CM | POA: Diagnosis not present

## 2017-01-13 DIAGNOSIS — R197 Diarrhea, unspecified: Secondary | ICD-10-CM | POA: Diagnosis not present

## 2017-01-29 DIAGNOSIS — I482 Chronic atrial fibrillation: Secondary | ICD-10-CM | POA: Diagnosis not present

## 2017-02-05 DIAGNOSIS — Z7901 Long term (current) use of anticoagulants: Secondary | ICD-10-CM | POA: Diagnosis not present

## 2017-02-05 DIAGNOSIS — I4891 Unspecified atrial fibrillation: Secondary | ICD-10-CM | POA: Diagnosis not present

## 2017-02-12 DIAGNOSIS — I482 Chronic atrial fibrillation: Secondary | ICD-10-CM | POA: Diagnosis not present

## 2017-02-12 DIAGNOSIS — Z7901 Long term (current) use of anticoagulants: Secondary | ICD-10-CM | POA: Diagnosis not present

## 2017-02-18 DIAGNOSIS — I4891 Unspecified atrial fibrillation: Secondary | ICD-10-CM | POA: Diagnosis not present

## 2017-02-18 DIAGNOSIS — I739 Peripheral vascular disease, unspecified: Secondary | ICD-10-CM | POA: Diagnosis not present

## 2017-02-18 DIAGNOSIS — I429 Cardiomyopathy, unspecified: Secondary | ICD-10-CM | POA: Diagnosis not present

## 2017-02-18 DIAGNOSIS — E785 Hyperlipidemia, unspecified: Secondary | ICD-10-CM | POA: Diagnosis not present

## 2017-02-18 DIAGNOSIS — Z7901 Long term (current) use of anticoagulants: Secondary | ICD-10-CM | POA: Diagnosis not present

## 2017-02-18 DIAGNOSIS — I482 Chronic atrial fibrillation: Secondary | ICD-10-CM | POA: Diagnosis not present

## 2017-02-27 DIAGNOSIS — Z7901 Long term (current) use of anticoagulants: Secondary | ICD-10-CM | POA: Diagnosis not present

## 2017-02-27 DIAGNOSIS — I482 Chronic atrial fibrillation: Secondary | ICD-10-CM | POA: Diagnosis not present

## 2017-03-02 DIAGNOSIS — I480 Paroxysmal atrial fibrillation: Secondary | ICD-10-CM | POA: Diagnosis not present

## 2017-03-02 DIAGNOSIS — I110 Hypertensive heart disease with heart failure: Secondary | ICD-10-CM | POA: Diagnosis not present

## 2017-03-02 DIAGNOSIS — I10 Essential (primary) hypertension: Secondary | ICD-10-CM | POA: Diagnosis not present

## 2017-03-02 DIAGNOSIS — I7389 Other specified peripheral vascular diseases: Secondary | ICD-10-CM | POA: Diagnosis not present

## 2017-03-02 DIAGNOSIS — E119 Type 2 diabetes mellitus without complications: Secondary | ICD-10-CM | POA: Diagnosis not present

## 2017-03-02 DIAGNOSIS — E782 Mixed hyperlipidemia: Secondary | ICD-10-CM | POA: Diagnosis not present

## 2017-03-02 DIAGNOSIS — E1142 Type 2 diabetes mellitus with diabetic polyneuropathy: Secondary | ICD-10-CM | POA: Diagnosis not present

## 2017-03-05 DIAGNOSIS — Z7901 Long term (current) use of anticoagulants: Secondary | ICD-10-CM | POA: Diagnosis not present

## 2017-03-05 DIAGNOSIS — I482 Chronic atrial fibrillation: Secondary | ICD-10-CM | POA: Diagnosis not present

## 2017-03-10 DIAGNOSIS — I482 Chronic atrial fibrillation: Secondary | ICD-10-CM | POA: Diagnosis not present

## 2017-03-18 DIAGNOSIS — I482 Chronic atrial fibrillation: Secondary | ICD-10-CM | POA: Diagnosis not present

## 2017-03-18 DIAGNOSIS — Z7901 Long term (current) use of anticoagulants: Secondary | ICD-10-CM | POA: Diagnosis not present

## 2017-04-01 DIAGNOSIS — Z7901 Long term (current) use of anticoagulants: Secondary | ICD-10-CM | POA: Diagnosis not present

## 2017-04-01 DIAGNOSIS — I482 Chronic atrial fibrillation: Secondary | ICD-10-CM | POA: Diagnosis not present

## 2017-04-15 DIAGNOSIS — I482 Chronic atrial fibrillation: Secondary | ICD-10-CM | POA: Diagnosis not present

## 2017-04-15 DIAGNOSIS — Z7901 Long term (current) use of anticoagulants: Secondary | ICD-10-CM | POA: Diagnosis not present

## 2017-04-30 DIAGNOSIS — I739 Peripheral vascular disease, unspecified: Secondary | ICD-10-CM | POA: Diagnosis not present

## 2017-05-13 DIAGNOSIS — Z7901 Long term (current) use of anticoagulants: Secondary | ICD-10-CM | POA: Diagnosis not present

## 2017-05-13 DIAGNOSIS — I482 Chronic atrial fibrillation: Secondary | ICD-10-CM | POA: Diagnosis not present

## 2017-06-01 DIAGNOSIS — I480 Paroxysmal atrial fibrillation: Secondary | ICD-10-CM | POA: Diagnosis not present

## 2017-06-01 DIAGNOSIS — E782 Mixed hyperlipidemia: Secondary | ICD-10-CM | POA: Diagnosis not present

## 2017-06-01 DIAGNOSIS — I7389 Other specified peripheral vascular diseases: Secondary | ICD-10-CM | POA: Diagnosis not present

## 2017-06-01 DIAGNOSIS — E1142 Type 2 diabetes mellitus with diabetic polyneuropathy: Secondary | ICD-10-CM | POA: Diagnosis not present

## 2017-06-01 DIAGNOSIS — I110 Hypertensive heart disease with heart failure: Secondary | ICD-10-CM | POA: Diagnosis not present

## 2017-06-01 DIAGNOSIS — E1151 Type 2 diabetes mellitus with diabetic peripheral angiopathy without gangrene: Secondary | ICD-10-CM | POA: Diagnosis not present

## 2017-06-10 DIAGNOSIS — Z7901 Long term (current) use of anticoagulants: Secondary | ICD-10-CM | POA: Diagnosis not present

## 2017-06-10 DIAGNOSIS — I482 Chronic atrial fibrillation: Secondary | ICD-10-CM | POA: Diagnosis not present

## 2017-06-11 DIAGNOSIS — D72828 Other elevated white blood cell count: Secondary | ICD-10-CM | POA: Diagnosis not present

## 2017-06-11 DIAGNOSIS — Z23 Encounter for immunization: Secondary | ICD-10-CM | POA: Diagnosis not present

## 2017-06-11 DIAGNOSIS — Z125 Encounter for screening for malignant neoplasm of prostate: Secondary | ICD-10-CM | POA: Diagnosis not present

## 2017-06-11 DIAGNOSIS — Z6822 Body mass index (BMI) 22.0-22.9, adult: Secondary | ICD-10-CM | POA: Diagnosis not present

## 2017-06-11 DIAGNOSIS — Z122 Encounter for screening for malignant neoplasm of respiratory organs: Secondary | ICD-10-CM | POA: Diagnosis not present

## 2017-06-11 DIAGNOSIS — Z0001 Encounter for general adult medical examination with abnormal findings: Secondary | ICD-10-CM | POA: Diagnosis not present

## 2017-06-30 DIAGNOSIS — Z7982 Long term (current) use of aspirin: Secondary | ICD-10-CM | POA: Diagnosis not present

## 2017-06-30 DIAGNOSIS — Z95828 Presence of other vascular implants and grafts: Secondary | ICD-10-CM | POA: Diagnosis not present

## 2017-06-30 DIAGNOSIS — I739 Peripheral vascular disease, unspecified: Secondary | ICD-10-CM | POA: Diagnosis not present

## 2017-07-02 DIAGNOSIS — H25813 Combined forms of age-related cataract, bilateral: Secondary | ICD-10-CM | POA: Diagnosis not present

## 2017-07-02 DIAGNOSIS — E119 Type 2 diabetes mellitus without complications: Secondary | ICD-10-CM | POA: Diagnosis not present

## 2017-07-15 DIAGNOSIS — Z7901 Long term (current) use of anticoagulants: Secondary | ICD-10-CM | POA: Diagnosis not present

## 2017-07-15 DIAGNOSIS — I482 Chronic atrial fibrillation: Secondary | ICD-10-CM | POA: Diagnosis not present

## 2017-08-12 DIAGNOSIS — Z7901 Long term (current) use of anticoagulants: Secondary | ICD-10-CM | POA: Diagnosis not present

## 2017-08-12 DIAGNOSIS — I482 Chronic atrial fibrillation: Secondary | ICD-10-CM | POA: Diagnosis not present

## 2017-08-19 DIAGNOSIS — E785 Hyperlipidemia, unspecified: Secondary | ICD-10-CM | POA: Diagnosis not present

## 2017-08-19 DIAGNOSIS — I482 Chronic atrial fibrillation: Secondary | ICD-10-CM | POA: Diagnosis not present

## 2017-08-19 DIAGNOSIS — Z7901 Long term (current) use of anticoagulants: Secondary | ICD-10-CM | POA: Diagnosis not present

## 2017-08-19 DIAGNOSIS — I428 Other cardiomyopathies: Secondary | ICD-10-CM | POA: Diagnosis not present

## 2017-08-19 DIAGNOSIS — I739 Peripheral vascular disease, unspecified: Secondary | ICD-10-CM | POA: Diagnosis not present

## 2017-09-09 DIAGNOSIS — Z7901 Long term (current) use of anticoagulants: Secondary | ICD-10-CM | POA: Diagnosis not present

## 2017-09-09 DIAGNOSIS — I482 Chronic atrial fibrillation: Secondary | ICD-10-CM | POA: Diagnosis not present

## 2017-09-10 DIAGNOSIS — E782 Mixed hyperlipidemia: Secondary | ICD-10-CM | POA: Diagnosis not present

## 2017-09-10 DIAGNOSIS — G4709 Other insomnia: Secondary | ICD-10-CM | POA: Diagnosis not present

## 2017-09-10 DIAGNOSIS — I480 Paroxysmal atrial fibrillation: Secondary | ICD-10-CM | POA: Diagnosis not present

## 2017-09-10 DIAGNOSIS — I7389 Other specified peripheral vascular diseases: Secondary | ICD-10-CM | POA: Diagnosis not present

## 2017-09-10 DIAGNOSIS — I110 Hypertensive heart disease with heart failure: Secondary | ICD-10-CM | POA: Diagnosis not present

## 2017-09-10 DIAGNOSIS — E1142 Type 2 diabetes mellitus with diabetic polyneuropathy: Secondary | ICD-10-CM | POA: Diagnosis not present

## 2017-09-23 DIAGNOSIS — Z7901 Long term (current) use of anticoagulants: Secondary | ICD-10-CM | POA: Diagnosis not present

## 2017-09-23 DIAGNOSIS — I482 Chronic atrial fibrillation: Secondary | ICD-10-CM | POA: Diagnosis not present

## 2017-09-24 DIAGNOSIS — E1142 Type 2 diabetes mellitus with diabetic polyneuropathy: Secondary | ICD-10-CM | POA: Diagnosis not present

## 2017-09-30 DIAGNOSIS — J449 Chronic obstructive pulmonary disease, unspecified: Secondary | ICD-10-CM | POA: Diagnosis present

## 2017-09-30 DIAGNOSIS — R0602 Shortness of breath: Secondary | ICD-10-CM | POA: Diagnosis not present

## 2017-09-30 DIAGNOSIS — I10 Essential (primary) hypertension: Secondary | ICD-10-CM | POA: Diagnosis not present

## 2017-09-30 DIAGNOSIS — I428 Other cardiomyopathies: Secondary | ICD-10-CM | POA: Diagnosis not present

## 2017-09-30 DIAGNOSIS — E872 Acidosis: Secondary | ICD-10-CM | POA: Diagnosis not present

## 2017-09-30 DIAGNOSIS — Z7901 Long term (current) use of anticoagulants: Secondary | ICD-10-CM | POA: Diagnosis not present

## 2017-09-30 DIAGNOSIS — Z888 Allergy status to other drugs, medicaments and biological substances status: Secondary | ICD-10-CM | POA: Diagnosis not present

## 2017-09-30 DIAGNOSIS — N179 Acute kidney failure, unspecified: Secondary | ICD-10-CM | POA: Diagnosis not present

## 2017-09-30 DIAGNOSIS — K5651 Intestinal adhesions [bands], with partial obstruction: Secondary | ICD-10-CM | POA: Diagnosis not present

## 2017-09-30 DIAGNOSIS — Z87891 Personal history of nicotine dependence: Secondary | ICD-10-CM | POA: Diagnosis not present

## 2017-09-30 DIAGNOSIS — D638 Anemia in other chronic diseases classified elsewhere: Secondary | ICD-10-CM | POA: Diagnosis present

## 2017-09-30 DIAGNOSIS — I480 Paroxysmal atrial fibrillation: Secondary | ICD-10-CM | POA: Diagnosis present

## 2017-09-30 DIAGNOSIS — K565 Intestinal adhesions [bands], unspecified as to partial versus complete obstruction: Secondary | ICD-10-CM | POA: Diagnosis not present

## 2017-09-30 DIAGNOSIS — I739 Peripheral vascular disease, unspecified: Secondary | ICD-10-CM | POA: Diagnosis present

## 2017-09-30 DIAGNOSIS — Z7982 Long term (current) use of aspirin: Secondary | ICD-10-CM | POA: Diagnosis not present

## 2017-09-30 DIAGNOSIS — K56609 Unspecified intestinal obstruction, unspecified as to partial versus complete obstruction: Secondary | ICD-10-CM | POA: Diagnosis not present

## 2017-09-30 DIAGNOSIS — E876 Hypokalemia: Secondary | ICD-10-CM | POA: Diagnosis present

## 2017-09-30 DIAGNOSIS — I4891 Unspecified atrial fibrillation: Secondary | ICD-10-CM | POA: Diagnosis not present

## 2017-09-30 DIAGNOSIS — E119 Type 2 diabetes mellitus without complications: Secondary | ICD-10-CM | POA: Diagnosis present

## 2017-09-30 DIAGNOSIS — E871 Hypo-osmolality and hyponatremia: Secondary | ICD-10-CM | POA: Diagnosis present

## 2017-09-30 DIAGNOSIS — R109 Unspecified abdominal pain: Secondary | ICD-10-CM | POA: Diagnosis not present

## 2017-09-30 DIAGNOSIS — Z7902 Long term (current) use of antithrombotics/antiplatelets: Secondary | ICD-10-CM | POA: Diagnosis not present

## 2017-09-30 DIAGNOSIS — N200 Calculus of kidney: Secondary | ICD-10-CM | POA: Diagnosis not present

## 2017-09-30 DIAGNOSIS — Z885 Allergy status to narcotic agent status: Secondary | ICD-10-CM | POA: Diagnosis not present

## 2017-09-30 DIAGNOSIS — I110 Hypertensive heart disease with heart failure: Secondary | ICD-10-CM | POA: Diagnosis present

## 2017-09-30 DIAGNOSIS — K56600 Partial intestinal obstruction, unspecified as to cause: Secondary | ICD-10-CM | POA: Diagnosis not present

## 2017-09-30 DIAGNOSIS — I5022 Chronic systolic (congestive) heart failure: Secondary | ICD-10-CM | POA: Diagnosis not present

## 2017-09-30 DIAGNOSIS — I2510 Atherosclerotic heart disease of native coronary artery without angina pectoris: Secondary | ICD-10-CM | POA: Diagnosis present

## 2017-10-07 DIAGNOSIS — I482 Chronic atrial fibrillation: Secondary | ICD-10-CM | POA: Diagnosis not present

## 2017-10-07 DIAGNOSIS — Z7901 Long term (current) use of anticoagulants: Secondary | ICD-10-CM | POA: Diagnosis not present

## 2017-10-15 DIAGNOSIS — K5649 Other impaction of intestine: Secondary | ICD-10-CM | POA: Diagnosis not present

## 2017-10-15 DIAGNOSIS — N178 Other acute kidney failure: Secondary | ICD-10-CM | POA: Diagnosis not present

## 2017-10-15 DIAGNOSIS — E1169 Type 2 diabetes mellitus with other specified complication: Secondary | ICD-10-CM | POA: Diagnosis not present

## 2017-10-15 DIAGNOSIS — Z23 Encounter for immunization: Secondary | ICD-10-CM | POA: Diagnosis not present

## 2017-10-21 DIAGNOSIS — I482 Chronic atrial fibrillation: Secondary | ICD-10-CM | POA: Diagnosis not present

## 2017-10-21 DIAGNOSIS — Z7901 Long term (current) use of anticoagulants: Secondary | ICD-10-CM | POA: Diagnosis not present

## 2017-11-04 DIAGNOSIS — Z7901 Long term (current) use of anticoagulants: Secondary | ICD-10-CM | POA: Diagnosis not present

## 2017-11-04 DIAGNOSIS — I4891 Unspecified atrial fibrillation: Secondary | ICD-10-CM | POA: Diagnosis not present

## 2017-11-18 DIAGNOSIS — Z7901 Long term (current) use of anticoagulants: Secondary | ICD-10-CM | POA: Diagnosis not present

## 2017-11-18 DIAGNOSIS — I4891 Unspecified atrial fibrillation: Secondary | ICD-10-CM | POA: Diagnosis not present

## 2017-12-08 DIAGNOSIS — I4891 Unspecified atrial fibrillation: Secondary | ICD-10-CM | POA: Diagnosis not present

## 2017-12-08 DIAGNOSIS — Z7901 Long term (current) use of anticoagulants: Secondary | ICD-10-CM | POA: Diagnosis not present

## 2017-12-17 DIAGNOSIS — I480 Paroxysmal atrial fibrillation: Secondary | ICD-10-CM | POA: Diagnosis not present

## 2017-12-17 DIAGNOSIS — I110 Hypertensive heart disease with heart failure: Secondary | ICD-10-CM | POA: Diagnosis not present

## 2017-12-17 DIAGNOSIS — E1121 Type 2 diabetes mellitus with diabetic nephropathy: Secondary | ICD-10-CM | POA: Diagnosis not present

## 2017-12-17 DIAGNOSIS — I7389 Other specified peripheral vascular diseases: Secondary | ICD-10-CM | POA: Diagnosis not present

## 2017-12-17 DIAGNOSIS — E1142 Type 2 diabetes mellitus with diabetic polyneuropathy: Secondary | ICD-10-CM | POA: Diagnosis not present

## 2017-12-17 DIAGNOSIS — F17228 Nicotine dependence, chewing tobacco, with other nicotine-induced disorders: Secondary | ICD-10-CM | POA: Diagnosis not present

## 2017-12-17 DIAGNOSIS — E782 Mixed hyperlipidemia: Secondary | ICD-10-CM | POA: Diagnosis not present

## 2017-12-24 ENCOUNTER — Inpatient Hospital Stay (HOSPITAL_COMMUNITY): Payer: Medicare Other

## 2017-12-24 ENCOUNTER — Inpatient Hospital Stay (HOSPITAL_COMMUNITY)
Admission: AD | Admit: 2017-12-24 | Discharge: 2018-01-03 | DRG: 871 | Disposition: E | Payer: Medicare Other | Source: Other Acute Inpatient Hospital | Attending: Pulmonary Disease | Admitting: Pulmonary Disease

## 2017-12-24 DIAGNOSIS — R58 Hemorrhage, not elsewhere classified: Secondary | ICD-10-CM | POA: Diagnosis not present

## 2017-12-24 DIAGNOSIS — I509 Heart failure, unspecified: Secondary | ICD-10-CM | POA: Diagnosis not present

## 2017-12-24 DIAGNOSIS — E785 Hyperlipidemia, unspecified: Secondary | ICD-10-CM | POA: Diagnosis not present

## 2017-12-24 DIAGNOSIS — I739 Peripheral vascular disease, unspecified: Secondary | ICD-10-CM | POA: Diagnosis not present

## 2017-12-24 DIAGNOSIS — I252 Old myocardial infarction: Secondary | ICD-10-CM | POA: Diagnosis not present

## 2017-12-24 DIAGNOSIS — Z8673 Personal history of transient ischemic attack (TIA), and cerebral infarction without residual deficits: Secondary | ICD-10-CM | POA: Diagnosis not present

## 2017-12-24 DIAGNOSIS — R0600 Dyspnea, unspecified: Secondary | ICD-10-CM | POA: Diagnosis not present

## 2017-12-24 DIAGNOSIS — I4891 Unspecified atrial fibrillation: Secondary | ICD-10-CM | POA: Diagnosis present

## 2017-12-24 DIAGNOSIS — E119 Type 2 diabetes mellitus without complications: Secondary | ICD-10-CM | POA: Diagnosis not present

## 2017-12-24 DIAGNOSIS — N493 Fournier gangrene: Secondary | ICD-10-CM | POA: Diagnosis not present

## 2017-12-24 DIAGNOSIS — A419 Sepsis, unspecified organism: Secondary | ICD-10-CM | POA: Diagnosis not present

## 2017-12-24 DIAGNOSIS — Z87891 Personal history of nicotine dependence: Secondary | ICD-10-CM | POA: Diagnosis not present

## 2017-12-24 DIAGNOSIS — D689 Coagulation defect, unspecified: Secondary | ICD-10-CM | POA: Diagnosis present

## 2017-12-24 DIAGNOSIS — J189 Pneumonia, unspecified organism: Secondary | ICD-10-CM | POA: Diagnosis not present

## 2017-12-24 DIAGNOSIS — E872 Acidosis: Secondary | ICD-10-CM | POA: Diagnosis present

## 2017-12-24 DIAGNOSIS — R6521 Severe sepsis with septic shock: Secondary | ICD-10-CM | POA: Diagnosis not present

## 2017-12-24 DIAGNOSIS — I2510 Atherosclerotic heart disease of native coronary artery without angina pectoris: Secondary | ICD-10-CM | POA: Diagnosis present

## 2017-12-24 DIAGNOSIS — J9690 Respiratory failure, unspecified, unspecified whether with hypoxia or hypercapnia: Secondary | ICD-10-CM | POA: Diagnosis not present

## 2017-12-24 DIAGNOSIS — J9601 Acute respiratory failure with hypoxia: Secondary | ICD-10-CM | POA: Diagnosis present

## 2017-12-24 DIAGNOSIS — J80 Acute respiratory distress syndrome: Secondary | ICD-10-CM | POA: Diagnosis not present

## 2017-12-24 DIAGNOSIS — N179 Acute kidney failure, unspecified: Secondary | ICD-10-CM | POA: Diagnosis present

## 2017-12-24 DIAGNOSIS — I10 Essential (primary) hypertension: Secondary | ICD-10-CM | POA: Diagnosis not present

## 2017-12-24 DIAGNOSIS — N5089 Other specified disorders of the male genital organs: Secondary | ICD-10-CM | POA: Diagnosis not present

## 2017-12-24 DIAGNOSIS — I96 Gangrene, not elsewhere classified: Secondary | ICD-10-CM | POA: Diagnosis not present

## 2017-12-24 DIAGNOSIS — I499 Cardiac arrhythmia, unspecified: Secondary | ICD-10-CM | POA: Diagnosis not present

## 2017-12-24 DIAGNOSIS — F329 Major depressive disorder, single episode, unspecified: Secondary | ICD-10-CM | POA: Diagnosis present

## 2017-12-24 DIAGNOSIS — J449 Chronic obstructive pulmonary disease, unspecified: Secondary | ICD-10-CM | POA: Diagnosis not present

## 2017-12-24 DIAGNOSIS — N501 Vascular disorders of male genital organs: Secondary | ICD-10-CM | POA: Diagnosis not present

## 2017-12-24 DIAGNOSIS — I110 Hypertensive heart disease with heart failure: Secondary | ICD-10-CM | POA: Diagnosis present

## 2017-12-24 DIAGNOSIS — Z4682 Encounter for fitting and adjustment of non-vascular catheter: Secondary | ICD-10-CM | POA: Diagnosis not present

## 2017-12-24 LAB — CBC WITH DIFFERENTIAL/PLATELET
ABS IMMATURE GRANULOCYTES: 0.5 10*3/uL — AB (ref 0.00–0.07)
Basophils Absolute: 0 10*3/uL (ref 0.0–0.1)
Basophils Relative: 0 %
EOS ABS: 0 10*3/uL (ref 0.0–0.5)
Eosinophils Relative: 0 %
HCT: 21.6 % — ABNORMAL LOW (ref 39.0–52.0)
Hemoglobin: 7 g/dL — ABNORMAL LOW (ref 13.0–17.0)
Lymphocytes Relative: 2 %
Lymphs Abs: 0.5 10*3/uL — ABNORMAL LOW (ref 0.7–4.0)
MCH: 28.7 pg (ref 26.0–34.0)
MCHC: 32.4 g/dL (ref 30.0–36.0)
MCV: 88.5 fL (ref 80.0–100.0)
MONO ABS: 0 10*3/uL — AB (ref 0.1–1.0)
MONOS PCT: 0 %
Myelocytes: 1 %
NEUTROS PCT: 95 %
NRBC: 0 /100{WBCs}
Neutro Abs: 23.1 10*3/uL — ABNORMAL HIGH (ref 1.7–7.7)
OTHER: 1 %
PLATELETS: 320 10*3/uL (ref 150–400)
Promyelocytes Relative: 1 %
RBC: 2.44 MIL/uL — ABNORMAL LOW (ref 4.22–5.81)
RDW: 16.2 % — ABNORMAL HIGH (ref 11.5–15.5)
WBC: 24.3 10*3/uL — ABNORMAL HIGH (ref 4.0–10.5)
nRBC: 0.1 % (ref 0.0–0.2)

## 2017-12-24 LAB — RENAL FUNCTION PANEL
Albumin: 1.4 g/dL — ABNORMAL LOW (ref 3.5–5.0)
Anion gap: 15 (ref 5–15)
BUN: 59 mg/dL — AB (ref 6–20)
CHLORIDE: 104 mmol/L (ref 98–111)
CO2: 17 mmol/L — AB (ref 22–32)
CREATININE: 2.68 mg/dL — AB (ref 0.61–1.24)
Calcium: 6.4 mg/dL — CL (ref 8.9–10.3)
GFR calc Af Amer: 29 mL/min — ABNORMAL LOW (ref >60)
GFR, EST NON AFRICAN AMERICAN: 25 mL/min — AB (ref >60)
Glucose, Bld: 257 mg/dL — ABNORMAL HIGH (ref 70–99)
POTASSIUM: 4.5 mmol/L (ref 3.5–5.1)
Phosphorus: 9.9 mg/dL — ABNORMAL HIGH (ref 2.5–4.6)
Sodium: 136 mmol/L (ref 135–145)

## 2017-12-24 LAB — POCT I-STAT 3, ART BLOOD GAS (G3+)
Acid-base deficit: 15 mmol/L — ABNORMAL HIGH (ref 0.0–2.0)
Acid-base deficit: 14 mmol/L — ABNORMAL HIGH (ref 0.0–2.0)
Bicarbonate: 15.1 mmol/L — ABNORMAL LOW (ref 20.0–28.0)
Bicarbonate: 16.6 mmol/L — ABNORMAL LOW (ref 20.0–28.0)
O2 SAT: 93 %
O2 Saturation: 78 %
PH ART: 7.015 — AB (ref 7.350–7.450)
PH ART: 6.993 — AB (ref 7.350–7.450)
TCO2: 17 mmol/L — ABNORMAL LOW (ref 22–32)
TCO2: 19 mmol/L — AB (ref 22–32)
pCO2 arterial: 59.1 mmHg — ABNORMAL HIGH (ref 32.0–48.0)
pCO2 arterial: 67.9 mmHg (ref 32.0–48.0)
pO2, Arterial: 63 mmHg — ABNORMAL LOW (ref 83.0–108.0)
pO2, Arterial: 100 mmHg (ref 83.0–108.0)

## 2017-12-24 LAB — FIBRINOGEN: FIBRINOGEN: 483 mg/dL — AB (ref 210–475)

## 2017-12-24 LAB — BLOOD GAS, ARTERIAL
Acid-base deficit: 13.5 mmol/L — ABNORMAL HIGH (ref 0.0–2.0)
Bicarbonate: 15.8 mmol/L — ABNORMAL LOW (ref 20.0–28.0)
Drawn by: 249101
FIO2: 100
MECHVT: 460 mL
O2 Saturation: 81.4 %
PEEP: 14 cmH2O
Patient temperature: 98.6
RATE: 24 resp/min
pCO2 arterial: 64.6 mmHg — ABNORMAL HIGH (ref 32.0–48.0)
pH, Arterial: 7.017 — CL (ref 7.350–7.450)
pO2, Arterial: 67.3 mmHg — ABNORMAL LOW (ref 83.0–108.0)

## 2017-12-24 LAB — POCT I-STAT, CHEM 8
BUN: 59 mg/dL — ABNORMAL HIGH (ref 6–20)
CALCIUM ION: 0.95 mmol/L — AB (ref 1.15–1.40)
Chloride: 101 mmol/L (ref 98–111)
Creatinine, Ser: 2.4 mg/dL — ABNORMAL HIGH (ref 0.61–1.24)
GLUCOSE: 198 mg/dL — AB (ref 70–99)
HEMATOCRIT: 20 % — AB (ref 39.0–52.0)
HEMOGLOBIN: 6.8 g/dL — AB (ref 13.0–17.0)
Potassium: 4.4 mmol/L (ref 3.5–5.1)
Sodium: 136 mmol/L (ref 135–145)
TCO2: 18 mmol/L — ABNORMAL LOW (ref 22–32)

## 2017-12-24 LAB — TYPE AND SCREEN
ABO/RH(D): O NEG
Antibody Screen: NEGATIVE

## 2017-12-24 LAB — MAGNESIUM: MAGNESIUM: 2.3 mg/dL (ref 1.7–2.4)

## 2017-12-24 LAB — MRSA PCR SCREENING: MRSA BY PCR: NEGATIVE

## 2017-12-24 LAB — PROCALCITONIN: PROCALCITONIN: 32.33 ng/mL

## 2017-12-24 LAB — HEPATIC FUNCTION PANEL
ALT: 22 U/L (ref 0–44)
AST: 35 U/L (ref 15–41)
Albumin: 1.4 g/dL — ABNORMAL LOW (ref 3.5–5.0)
Alkaline Phosphatase: 88 U/L (ref 38–126)
BILIRUBIN DIRECT: 0.1 mg/dL (ref 0.0–0.2)
BILIRUBIN INDIRECT: 0.4 mg/dL (ref 0.3–0.9)
Total Bilirubin: 0.5 mg/dL (ref 0.3–1.2)
Total Protein: 4 g/dL — ABNORMAL LOW (ref 6.5–8.1)

## 2017-12-24 LAB — LACTIC ACID, PLASMA: LACTIC ACID, VENOUS: 6.7 mmol/L — AB (ref 0.5–1.9)

## 2017-12-24 LAB — PROTIME-INR
INR: >10
INR: >10
Prothrombin Time: >90 seconds — ABNORMAL HIGH (ref 11.4–15.2)
Prothrombin Time: >90 s — ABNORMAL HIGH (ref 11.4–15.2)

## 2017-12-24 LAB — ABO/RH: ABO/RH(D): O NEG

## 2017-12-24 LAB — GLUCOSE, CAPILLARY
GLUCOSE-CAPILLARY: 201 mg/dL — AB (ref 70–99)
Glucose-Capillary: 210 mg/dL — ABNORMAL HIGH (ref 70–99)

## 2017-12-24 LAB — APTT: aPTT: 155 s — ABNORMAL HIGH (ref 24–36)

## 2017-12-24 MED ORDER — SODIUM CHLORIDE 0.9 % IV SOLN
0.0000 ug/min | INTRAVENOUS | Status: DC
  Administered 2017-12-24: 20 ug/min via INTRAVENOUS
  Administered 2017-12-24 (×2): 380 ug/min via INTRAVENOUS
  Filled 2017-12-24: qty 4
  Filled 2017-12-24: qty 40
  Filled 2017-12-24 (×5): qty 4

## 2017-12-24 MED ORDER — DOCUSATE SODIUM 50 MG/5ML PO LIQD: 100.0000 mg | Freq: Two times a day (BID) | ORAL | Status: DC | PRN

## 2017-12-24 MED ORDER — ALBUTEROL SULFATE (2.5 MG/3ML) 0.083% IN NEBU: 2.5000 mg | INHALATION_SOLUTION | RESPIRATORY_TRACT | Status: DC | PRN

## 2017-12-24 MED ORDER — SODIUM CHLORIDE 0.9% IV SOLUTION
Freq: Once | INTRAVENOUS | Status: AC
  Administered 2017-12-24: 22:00:00 via INTRAVENOUS

## 2017-12-24 MED ORDER — NOREPINEPHRINE 16 MG/250ML-% IV SOLN
0.0000 ug/min | INTRAVENOUS | Status: DC
  Administered 2017-12-24 (×2): 40 ug/min via INTRAVENOUS
  Administered 2017-12-24: 100 ug/min via INTRAVENOUS
  Filled 2017-12-24 (×3): qty 250

## 2017-12-24 MED ORDER — MIDAZOLAM HCL 2 MG/2ML IJ SOLN: 1.0000 mg | INTRAMUSCULAR | Status: DC | PRN

## 2017-12-24 MED ORDER — FAMOTIDINE IN NACL 20-0.9 MG/50ML-% IV SOLN
20.0000 mg | INTRAVENOUS | Status: DC
  Filled 2017-12-24: qty 50

## 2017-12-24 MED ORDER — SODIUM CHLORIDE 0.9 % IV SOLN
INTRAVENOUS | Status: AC
  Administered 2017-12-24: 22:00:00 via INTRAVENOUS

## 2017-12-24 MED ORDER — ALBUMIN HUMAN 5 % IV SOLN
50.0000 g | Freq: Once | INTRAVENOUS | Status: AC
  Administered 2017-12-24: 50 g via INTRAVENOUS
  Filled 2017-12-24: qty 750

## 2017-12-24 MED ORDER — FENTANYL CITRATE (PF) 100 MCG/2ML IJ SOLN: 50.0000 ug | Freq: Once | INTRAMUSCULAR | Status: DC

## 2017-12-24 MED ORDER — IPRATROPIUM-ALBUTEROL 0.5-2.5 (3) MG/3ML IN SOLN
3.0000 mL | Freq: Four times a day (QID) | RESPIRATORY_TRACT | Status: DC
  Administered 2017-12-24: 3 mL via RESPIRATORY_TRACT
  Filled 2017-12-24: qty 3

## 2017-12-24 MED ORDER — SODIUM BICARBONATE 8.4 % IV SOLN
INTRAVENOUS | Status: AC
  Filled 2017-12-24: qty 100

## 2017-12-24 MED ORDER — SODIUM BICARBONATE 8.4 % IV SOLN
100.0000 meq | Freq: Once | INTRAVENOUS | Status: AC
  Administered 2017-12-24 (×2): 100 meq via INTRAVENOUS

## 2017-12-24 MED ORDER — PHENYLEPHRINE HCL-NACL 10-0.9 MG/250ML-% IV SOLN
0.0000 ug/min | INTRAVENOUS | Status: DC
  Administered 2017-12-24: 250 ug/min via INTRAVENOUS
  Administered 2017-12-24: 200 ug/min via INTRAVENOUS
  Filled 2017-12-24 (×4): qty 250

## 2017-12-24 MED ORDER — AMIODARONE IV BOLUS ONLY 150 MG/100ML
150.0000 mg | Freq: Once | INTRAVENOUS | Status: AC
  Administered 2017-12-24: 150 mg via INTRAVENOUS

## 2017-12-24 MED ORDER — EPINEPHRINE PF 1 MG/ML IJ SOLN
0.5000 ug/min | INTRAVENOUS | Status: DC
  Filled 2017-12-24: qty 4

## 2017-12-24 MED ORDER — VASOPRESSIN 20 UNIT/ML IV SOLN
0.0300 [IU]/min | INTRAVENOUS | Status: DC
  Administered 2017-12-24: 0.03 [IU]/min via INTRAVENOUS
  Filled 2017-12-24 (×2): qty 2

## 2017-12-24 MED ORDER — SODIUM BICARBONATE 8.4 % IV SOLN
INTRAVENOUS | Status: DC
  Filled 2017-12-24 (×3): qty 150

## 2017-12-24 MED ORDER — FENTANYL 2500MCG IN NS 250ML (10MCG/ML) PREMIX INFUSION
25.0000 ug/h | INTRAVENOUS | Status: DC
  Administered 2017-12-24: 50 ug/h via INTRAVENOUS

## 2017-12-24 MED ORDER — SODIUM BICARBONATE 8.4 % IV SOLN
INTRAVENOUS | Status: AC
  Administered 2017-12-24: 100 meq via INTRAVENOUS
  Filled 2017-12-24: qty 50

## 2017-12-24 MED ORDER — INSULIN ASPART 100 UNIT/ML ~~LOC~~ SOLN
0.0000 [IU] | SUBCUTANEOUS | Status: DC
  Administered 2017-12-24 (×2): 3 [IU] via SUBCUTANEOUS

## 2017-12-24 MED ORDER — FENTANYL BOLUS VIA INFUSION
50.0000 ug | INTRAVENOUS | Status: DC | PRN
  Filled 2017-12-24: qty 50

## 2017-12-24 MED ORDER — CALCIUM GLUCONATE-NACL 1-0.675 GM/50ML-% IV SOLN
1.0000 g | Freq: Once | INTRAVENOUS | Status: AC
  Administered 2017-12-24: 1000 mg via INTRAVENOUS
  Filled 2017-12-24: qty 50

## 2017-12-24 MED ORDER — BISACODYL 10 MG RE SUPP: 10.0000 mg | Freq: Every day | RECTAL | Status: DC | PRN

## 2017-12-24 MED ORDER — NOREPINEPHRINE 4 MG/250ML-% IV SOLN: 0.0000 ug/min | INTRAVENOUS | Status: DC

## 2017-12-24 MED ORDER — VITAMIN K1 10 MG/ML IJ SOLN
10.0000 mg | Freq: Every day | INTRAMUSCULAR | Status: DC
  Filled 2017-12-24 (×3): qty 1

## 2017-12-24 MED ORDER — EPINEPHRINE PF 1 MG/10ML IJ SOSY
PREFILLED_SYRINGE | INTRAMUSCULAR | Status: AC
  Filled 2017-12-24: qty 10

## 2017-12-24 MED ORDER — SODIUM CHLORIDE 0.9 % IV SOLN
250.0000 mL | INTRAVENOUS | Status: DC
  Administered 2017-12-24: 250 mL via INTRAVENOUS

## 2017-12-24 MED ORDER — AMIODARONE HCL IN DEXTROSE 360-4.14 MG/200ML-% IV SOLN
INTRAVENOUS | Status: AC
  Filled 2017-12-24: qty 200

## 2017-12-24 MED ORDER — SODIUM BICARBONATE 8.4 % IV SOLN
100.0000 meq | Freq: Once | INTRAVENOUS | Status: AC
  Administered 2017-12-24: 100 meq via INTRAVENOUS
  Filled 2017-12-24: qty 100

## 2017-12-25 LAB — PREPARE FRESH FROZEN PLASMA
UNIT DIVISION: 0
Unit division: 0

## 2017-12-25 LAB — BPAM FFP
BLOOD PRODUCT EXPIRATION DATE: 201911232359
BLOOD PRODUCT EXPIRATION DATE: 201911232359
Blood Product Expiration Date: 201911232359
Blood Product Expiration Date: 201911232359
ISSUE DATE / TIME: 201911212150
ISSUE DATE / TIME: 201911212150
ISSUE DATE / TIME: 201911212150
ISSUE DATE / TIME: 201911212150
UNIT TYPE AND RH: 6200
UNIT TYPE AND RH: 6200
Unit Type and Rh: 600
Unit Type and Rh: 6200

## 2017-12-25 LAB — HIV ANTIBODY (ROUTINE TESTING W REFLEX): HIV SCREEN 4TH GENERATION: NONREACTIVE

## 2017-12-25 LAB — PATHOLOGIST SMEAR REVIEW

## 2017-12-25 NOTE — Progress Notes (Signed)
Dr. Charolotte Eke came to unit and saw the prognosis and the effort by staff to keep patient alive. Family at bedside very upset about patient's situation. Dr Charolotte Eke discussed with family about how poor the outcome of the patient. Patient's family agreed to make him DNR and ultimately stop all treatments. Patient expired at 23:14 with confirmation of two RNs Vincent Gros and Kristeen Miss.

## 2017-12-25 NOTE — Progress Notes (Signed)
Wasted 200 of Fentanyl with Kristeen Miss RN.

## 2017-12-28 ENCOUNTER — Telehealth: Payer: Self-pay

## 2017-12-28 NOTE — Telephone Encounter (Signed)
On 12/28/17 I received a d/c from [facility] (original). DC is for cremation. Patient is a patient of Doctor Agarwala. DC will be taken to [phone] for signature.  On 01/06/18 I received the dc back from Doctor Olalre. I got the dc ready and called the funeral home to let them know dc was mailed to vital records per the funeral home request.

## 2017-12-29 LAB — CULTURE, BLOOD (ROUTINE X 2): CULTURE: NO GROWTH

## 2018-01-03 NOTE — Consult Note (Signed)
Urology Consult  Referring physician: Dr. Janee Morn Reason for referral: fournier's gangrene  Chief Complaint: fournier's gangrene  History of Present Illness: Luke Anderson is a 57yo with a hx of CAD s/p MI, afib on coumadin, COPD, DMII who presented to [place] [facility] with concern for fournier's gangrene. Labs obtained at [facility] include : WBC 26.5, Hgb 11.4, sCr 3.2, BUN 65, bicarb 12, BNP 23700, INR unable to calculate, PT >200, lactic acid 10.3. He was transferred to [facility] for possible surgical intervention.  He is currently intubated and is unable to give additional history. Of note the patient is on multiple vasopressors and nearly arrested during intubation. INT is currently >10. hgb 6.8. Bicarb 18. PH 6.997.  No past medical history on file.   Medications: I have reviewed the patient's current medications. Allergies:  Allergies  Allergen Reactions  . Codeine Itching, Nausea And Vomiting and Nausea Only  . Lorazepam Other (See Comments)    confusion      No family history on file. Social History:  has no tobacco, alcohol, and drug history on file.  Review of Systems  Unable to perform ROS: Intubated    Physical Exam:  Vital signs in last 24 hours: Temp:  [98.2 F (36.8 C)-98.5 F (36.9 C)] 98.2 F (36.8 C) (11/21 1800) Pulse Rate:  [144] 144 (11/21 1700) Resp:  [22-28] 26 (11/21 1800) Arterial Line BP: (82-120)/(50-66) 120/66 (11/21 1800) FiO2 (%):  [100 %] 100 % (11/21 1735) Weight:  [68.1 kg] 68.1 kg (11/21 1700) Physical Exam  Constitutional: He appears well-developed. He is intubated.  HENT:  Head: Normocephalic and atraumatic.  Eyes: Right eye exhibits no discharge. Left eye exhibits no discharge. No scleral icterus.  Neck: No tracheal deviation present. No thyromegaly present.  Cardiovascular: An irregular rhythm present. Exam reveals decreased pulses.  Respiratory: He is intubated. No respiratory distress.  GI: Soft. He exhibits no distension.  Hernia confirmed negative in the right inguinal area and confirmed negative in the left inguinal area.  Genitourinary: Right testis shows swelling. Left testis shows swelling. Penile erythema present.  Genitourinary Comments: Necrosis of entire scrotal skin and penile skin. Erythema extends to above penis and tracts up the right inguinal canal.  Musculoskeletal: He exhibits no edema or deformity.  Neurological: He is unresponsive.  Skin: Skin is warm and dry.    Laboratory Data:  Results for orders placed or performed during the hospital encounter of Jan 06, 2018 (from the past 72 hour(s))  CBC with Differential/Platelet     Status: Abnormal (Preliminary result)   Collection Time: 06-Jan-2018  5:08 PM  Result Value Ref Range   WBC PENDING 4.0 - 10.5 K/uL   RBC 2.44 (L) 4.22 - 5.81 MIL/uL   Hemoglobin 7.0 (L) 13.0 - 17.0 g/dL   HCT 09.8 (L) 11.9 - 14.7 %   MCV 88.5 80.0 - 100.0 fL   MCH 28.7 26.0 - 34.0 pg   MCHC 32.4 30.0 - 36.0 g/dL   RDW 82.9 (H) 56.2 - 13.0 %   Platelets 320 150 - 400 K/uL    Comment: Performed at [facility], [address] [place], [place] [address]   nRBC PENDING 0.0 - 0.2 %   Neutrophils Relative % PENDING %   Neutro Abs PENDING 1.7 - 7.7 K/uL   Band Neutrophils PENDING %   Lymphocytes Relative PENDING %   Lymphs Abs PENDING 0.7 - 4.0 K/uL   Monocytes Relative PENDING %   Monocytes Absolute PENDING 0.1 - 1.0 K/uL  Eosinophils Relative PENDING %   Eosinophils Absolute PENDING 0.0 - 0.5 K/uL   Basophils Relative PENDING %   Basophils Absolute PENDING 0.0 - 0.1 K/uL   WBC Morphology PENDING    RBC Morphology PENDING    Smear Review PENDING    Other PENDING %   nRBC PENDING 0 /100 WBC   Metamyelocytes Relative PENDING %   Myelocytes PENDING %   Promyelocytes Relative PENDING %   Blasts PENDING %  I-STAT 3, arterial blood gas (G3+)     Status: Abnormal   Collection Time: 14-Jan-2018  5:19 PM  Result Value Ref Range   pH, Arterial 7.015 (LL) 7.350 -  7.450   pCO2 arterial 59.1 (H) 32.0 - 48.0 mmHg   pO2, Arterial 63.0 (L) 83.0 - 108.0 mmHg   Bicarbonate 15.1 (L) 20.0 - 28.0 mmol/L   TCO2 17 (L) 22 - 32 mmol/L   O2 Saturation 78.0 %   Acid-base deficit 15.0 (H) 0.0 - 2.0 mmol/L   Patient temperature 98.2 F    Collection site ARTERIAL LINE    Drawn by RT    Sample type ARTERIAL   Glucose, capillary     Status: Abnormal   Collection Time: 2018/01/14  5:20 PM  Result Value Ref Range   Glucose-Capillary 210 (H) 70 - 99 mg/dL   No results found for this or any previous visit (from the past 240 hour(s)). Creatinine: No results for input(s): CREATININE in the last 168 hours. Baseline Creatinine: unknown  Impression/Assessment:  57yo with fournier's gangrene and multisystem organ failure  Plan:  1. Fournier's gangrene: I discussed the natural history of fournier's gangrene with the patient's daughter and the treatment of fournier's gangrene including surgical debridement of his scrotal skin, penile skin. Given the patient's current unstable condition and INR> 10 he is not a surgical candidate for debridement. If the patient stabilizes and his INR is corrected he will be taken to the OR for debridement.    Luke Anderson 2018/01/14, 6:13 PM

## 2018-01-03 NOTE — Death Summary Note (Signed)
DEATH SUMMARY   Patient Details  Name: Luke Anderson MRN: [id number] DOB: 1960-03-29  Admission/Discharge Information   Admit Date:  04-Jan-2018  Date of Death:  01/04/2018  Time of Death:  06/21/2312  Length of Stay: 0  Referring Physician: Blane Ohara, MD   Reason(s) for Hospitalization  septic shock  Diagnoses  Preliminary cause of death: Septic shock due to Fournier's gangrene. Secondary Diagnoses (including complications and co-morbidities):  Active Problems:   Septic shock [facility])   Brief Hospital Course (including significant findings, care, treatment, and services provided and events leading to death)  Luke Anderson is a 57 y.o. year old male who was transferred from [facility] for septic shock secondary to Fournier's gangrene.  There he had presented with respiratory distress after a 5-day history of not feeling well and complains of scrotal pain.  On presentation he was in renal failure was found to have a necrotic scrotum and severe lactic acidosis and an x-ray compatible with ARDS.  He was started on vasopressors and intubated and transferred here.  He was seen by urology on arrival but deemed to be inoperable due to his severe coagulopathy and hemodynamic instability.  In the ICU he received mechanical ventilation fluid resuscitation blood products and increasing doses of vasopressors.  Despite aggressive attempts at resuscitation we were not able to achieve hemodynamic stability and after nearly 5 hours of resuscitation his blood pressure remained in the 60s as did his O2 saturations.  His pupils were fixed and dilated.  I discussed the situation with the gathered family members who agreed to terminate all active resuscitation and allow the patient to pass away naturally.    Pertinent Labs and Studies  Significant Diagnostic Studies Dg Chest Port 1 View  Result Date: Jan 04, 2018 CLINICAL DATA:  Respiratory failure. Pt was unresponsiverespiratory failure EXAM: PORTABLE CHEST  1 VIEW COMPARISON:  2018-01-04 FINDINGS: Endotracheal tube, NG tube, and central venous line unchanged. Extensive financial opacities of slightly improved from comparison exam. No pneumothorax. IMPRESSION: 1. Stable support apparatus. 2. Improved interstitial/pulmonary edema pattern. Electronically Signed   By: Luke Anderson M.D.   On: 01-04-2018 18:54    Microbiology Recent Results (from the past 240 hour(s))  MRSA PCR Screening     Status: None   Collection Time: 01/04/2018  6:10 PM  Result Value Ref Range Status   MRSA by PCR NEGATIVE NEGATIVE Final    Comment:        The GeneXpert MRSA Assay (FDA approved for NASAL specimens only), is one component of a comprehensive MRSA colonization surveillance program. It is not intended to diagnose MRSA infection nor to guide or monitor treatment for MRSA infections. Performed at [facility], [address], [place], [place] [address]     Lab Basic Metabolic Panel: Recent Labs  Lab January 04, 2018 1708 2018-01-04 1839  NA 136 136  K 4.5 4.4  CL 104 101  CO2 17*  --   GLUCOSE 257* 198*  BUN 59* 59*  CREATININE 2.68* 2.40*  CALCIUM 6.4*  --   MG 2.3  --   PHOS 9.9*  --    Liver Function Tests: Recent Labs  Lab 01/04/18 1708  AST 35  ALT 22  ALKPHOS 88  BILITOT 0.5  PROT 4.0*  ALBUMIN 1.4*  1.4*   No results for input(s): LIPASE, AMYLASE in the last 168 hours. No results for input(s): AMMONIA in the last 168 hours. CBC: Recent Labs  Lab 01-04-2018 1708 Jan 04, 2018 1839  WBC 24.3*  --  NEUTROABS 23.1*  --   HGB 7.0* 6.8*  HCT 21.6* 20.0*  MCV 88.5  --   PLT 320  --    Cardiac Enzymes: No results for input(s): CKTOTAL, CKMB, CKMBINDEX, TROPONINI in the last 168 hours. Sepsis Labs: Recent Labs  Lab 2018/01/17 1708  PROCALCITON 32.33  WBC 24.3*  LATICACIDVEN 6.7*    Procedures/Operations  Mechanical ventilation, vasopressors, blood products.   Luke Anderson January 17, 2018, 11:20 PM

## 2018-01-03 NOTE — Consult Note (Signed)
Reason for Consult:Fournier's gangrene Referring Physician: R. Agarwala  Luke Anderson is an 57 y.o. male.  HPI: 57yo M with PMHx including DM, CHF, COPD, on coumadin for a fib was transferred up from [facility] due to Fournier's gangrene. He is intubated and is being admitted to the ICU by the CCM team. Of note, prior to transfer his PT was >200 and his INR could not be measured. He cannot assist with the history.  PMHx: above  PSHx: unknown, has multiple scars  No family history on file.  Social History:  has no tobacco, alcohol, and drug history on file.  Allergies: Allergies not on file  Medications: I have reviewed the patient's current medications.  Results for orders placed or performed during the hospital encounter of 01-04-18 (from the past 48 hour(s))  Glucose, capillary     Status: Abnormal   Collection Time: Jan 04, 2018  5:20 PM  Result Value Ref Range   Glucose-Capillary 210 (H) 70 - 99 mg/dL    No results found.  Review of Systems  Unable to perform ROS: Intubated   Temperature 98.5 F (36.9 C), temperature source Oral, height 5\' 7"  (1.702 m), weight 68.1 kg. Physical Exam  Constitutional: He appears well-developed.  thin  HENT:  Head: Normocephalic.  Oral ETT  Neck: No tracheal deviation present. No thyromegaly present.  Cardiovascular:  HR 150, irregular  Respiratory:  ventilated  GI: Soft. He exhibits no distension. There is no tenderness. There is no rebound and no guarding.  Ecchymotic area R lateral inguinal region  Genitourinary:  Genitourinary Comments: Scrotum with large area of necrosis anteriorly and small spot inferiorly. No adjacent surrounding induration or abscess, penis also appears dusky and edematous  Neurological:  sedated  Skin:  cool    Assessment/Plan: 57yo M with sepsis and Fournier's gangrene including scrotal necrosis, appears localized to the scrotum - agree with Vanc/Zosyn/Clinda. He needs correction of his  anticoagulation. I have consulted Dr. Ronne Binning from Urology. I am available to assist with surgical intervention if needed.  Liz Malady January 04, 2018, 5:30 PM

## 2018-01-03 NOTE — H&P (Addendum)
NAME:  Luke Anderson, MRN:  [id number], DOB:  October 29, 1960, LOS: 0 ADMISSION DATE:  January 04, 2018, CONSULTATION DATE:  Jan 04, 2018 REFERRING MD:  [facility], CHIEF COMPLAINT:  Septic shock  Brief History   57 yoM transferred from [facility] for hypoxic respiratory failure and septic shock concerning for fournier's gangrene.   History of present illness   HPI obtained from medical chart review as patient is intubated and sedated. Some information obtained from daughters at bedside.   58 year old male with PMH significant for but not limited to Afib on coumadin, CVA, HTN, heart failure, CAD s/p MI, HLD, COPD, pancreatitis, depression, and DM who presented to [facility] with shortness of breath.  Daughter reports patient lives alone but she lives next door.  Complained of a pimple on his scrotum on 11/16 to daughter.  Since, complained of chills and fever.  Reports he was doing ok until he developed respiratory distress this morning and presented to [facility] ER.    In ER, patient found to be hypoxic, in respiratory distress requiring BiPAP and in Afib w/ RVR.  Also noted to have necrotic scrotum.  Labs significant for WBC 26.5, Hgb 11.4, sCr 3.2, BUN 65, bicarb 12, BNP 23700, INR undetectable, PT >200, lactic acid 10.3, CXR with diffuse patchy airspace opacities.  Cultures sent and started on vancomycin and imipenem/cilastatin.  Hypotension treated with 4L NS and started on levophed after central line placed. Bicarb gtt started for profound metabolic acidosis.  Patient was persistently hypoxic requiring intubation.  During procedure, patient near arrested but no loss of pulses.  Since patient as required higher PEEP and FiO2 1.0 for ongoing hypoxia. Patient transferred to [facility] for surgical and possible urological consult.  PCCM to admit.   Past Medical History  Former smoker, Afib on coumadin, CVA, HTN, heart failure, CAD s/p MI, HLD, COPD, pancreatitis, depression, PVD w/prior fem-pop bypass,  CVA with no deficits, DM, prior SBO  Significant Hospital Events   11/21 Admit  Consults:  11/21 CCS 11/21 Urology  Procedures:  11/21 ETT >> 11/21 left radial aline >> 11/21 R IJ TL CVC >> 11/21 Foley  Significant Diagnostic Tests:   Micro Data:  Culture data at [facility] 11/21 11/21 BC x 2 >> 11/21 UC >> 11/21 trach asp >>  Antimicrobials:  OSH >> vanc/  Imipenem/cilastatin 11/21 vanc >> 11/21 zosyn >> 11/21 clindamycin >>  Interim history/subjective:  Arrived on fentanyl 50 mcg, levophed 30 mcg and peep 10 and FiO2 1.0 on MV.  Objective   Temperature 98.5 F (36.9 C), temperature source Oral, height 5\' 7"  (1.702 m), weight 68.1 kg.       No intake or output data in the 24 hours ending 01/04/2018 1732 Filed Weights   01-04-18 1700  Weight: 68.1 kg    Examination: General:  Critically ill adult male on MV in NAD HEENT: pupils 4/sluggish, ETT 24 at lip, very poor dentition and loose lower tooth, dried blood noted orally Neuro: unresponsive, not f/c, no response to noxious stimuli CV: IRIR 150's, weak pulses PULM: even/non-labored on MV, lungs bilaterally coarse, bilateral rales GI: soft, ND, hypoBS, foley with good UOP/ light amber urine, scrotum as below- foul smelling Extremities: cool/dry, skin ashen, no edema Skin: no rashes      Resolved Hospital Problem list     Assessment & Plan:  Septic shock with scrotal necrosis  P:  Tele monitoring  CCS consulted- at bedside and consulted Urology Continue levophed (limit given afib w/rvr), add  vasopressin and neosynephrine Goal MAP >65 Trend lactic and CVP Additional LR 500 ml bolus now Assess TTE  Scrotal necrosis concerning for fournier's gangrene  P:  Urology to see Will ideally go to OR when INR corrects Start vanc, zosyn, and clindamycin Pan culture  Acute hypoxic respiratory failure concerning for ALI , hx COPD  P:  Stat ABG -7.015/ 59/ 63/ 15.1 Full MV support PRVC- max vent  settings for oxygenation and acidosis- TV dropped to 6 cc/kg, rate 24- currently not auto peeping but will need to monitor for PEEP increased to 14, will need to wean FiO2 prior to dropping PEEP Unable to diurese at this point Duonebs q 4 and albuterol PRN Send for trach asp VAP protocol   Severe metabolic acidosis AKI - unknown baseline sCr -s/p 4L NS at OHS with good UOP P:  2 amp bicarb now, and continue bicarb gtt at 125 m/hr Stat renal panel/ mag/ phos Trend UOP Strict I/O's/ daily wts  Afib with RVR on coumadin P:  EKG now amio bolus 150mg  now Limit levophed due to beta affect Hold further AC Assess electrolytes, goal K>4/ Mag >2  Supra therapeutic INR- undetectable at O[facility], PT >200 P:  Stat INR and T&S Hold home coumadin  Best practice:  Diet: NPO Pain/Anxiety/Delirium protocol (if indicated): fentanyl gtt/ prn versed VAP protocol (if indicated): yes DVT prophylaxis: SCDs only  GI prophylaxis: pepcid Glucose control: SSI Mobility: BR Code Status: Full Family Communication: Daughters, Boneta Lucks [phone]) and Brayton Caves [phone]) updated at bedside of guarded prognosis. Disposition: ICU  Labs   CBC: No results for input(s): WBC, NEUTROABS, HGB, HCT, MCV, PLT in the last 168 hours.  Basic Metabolic Panel: No results for input(s): NA, K, CL, CO2, GLUCOSE, BUN, CREATININE, CALCIUM, MG, PHOS in the last 168 hours. GFR: CrCl cannot be calculated (No successful lab value found.). No results for input(s): PROCALCITON, WBC, LATICACIDVEN in the last 168 hours.  Liver Function Tests: No results for input(s): AST, ALT, ALKPHOS, BILITOT, PROT, ALBUMIN in the last 168 hours. No results for input(s): LIPASE, AMYLASE in the last 168 hours. No results for input(s): AMMONIA in the last 168 hours.  ABG No results found for: PHART, PCO2ART, PO2ART, HCO3, TCO2, ACIDBASEDEF, O2SAT   Coagulation Profile: No results for input(s): INR, PROTIME in the last 168  hours.  Cardiac Enzymes: No results for input(s): CKTOTAL, CKMB, CKMBINDEX, TROPONINI in the last 168 hours.  HbA1C: No results found for: HGBA1C  CBG: Recent Labs  Lab 04-Jan-2018 1720  GLUCAP 210*    Review of Systems:   Unable  Past Medical History  See HPI  Surgical History   unable  Social History    Lives alone, rare ETOH, former smoker  Family History   His family history is not on file.   Allergies Allergies not on file    Home Medications  Prior to Admission medications   Not on File     Critical care time: 60 mins    Posey Boyer, AGACNP-BC [facility] Pgr: [phone] or if no answer [phone] 2018-01-04, 5:57 PM

## 2018-01-03 NOTE — Progress Notes (Signed)
   13-Jan-2018 2300  Clinical Encounter Type  Visited With Health care provider;Family  Visit Type Initial;Death  Referral From Nurse  Spiritual Encounters  Spiritual Needs Emotional;Grief support  Stress Factors  Family Stress Factors Major life changes;Loss   Responded to page for actively dying/pt death.  Met w/ pt's 2 daughters and family at bedside.  Present when 2 RNs ck'd to pronounce death.  Available to family when they went bk to see decedent after nursing staff prepped body, with RN, explained some of the process now to daughter (funeral home contact, etc).  Myra Gianotti resident, [phone]

## 2018-01-03 NOTE — Progress Notes (Signed)
CRITICAL VALUE ALERT  Critical Value:  LA 6.7, Ca 6.4  Date & Time Notied:  1860 01-07-18  Provider Notified: Nehemiah Settle, CCM NP  Orders Received/Actions taken: none at this time

## 2018-01-03 NOTE — Progress Notes (Signed)
CRITICAL VALUE ALERT  Critical Value: INR   Date & Time Notied: Jan 04, 2018  Provider Notified: Pola Corn RN   Orders Received/Actions taken: RN will notify MD. Waiting for orders

## 2018-01-03 NOTE — Progress Notes (Signed)
CRITICAL VALUE ALERT  Critical Value: ABG  Date & Time Notied: December 25, 2017 21:10  Provider Notified: Pola Corn MD   Orders Received/Actions taken: Waiting for orders

## 2018-01-03 NOTE — Progress Notes (Signed)
Pt critically unstable on arrival. NP at bs. NP and MD's continually updated to assessments, interventions, and responses. Orders received.

## 2018-01-03 NOTE — Progress Notes (Signed)
Worsening acidosis on ABG, better oxygenation.     Will increase TV to 66ml and attempt to maintain minute ventilation.   Additional 2 amps bicarb now Bicarb gtt increased to 200 ml/hr Repeat ABG in 1 hour   Posey Boyer, AGACNP-BC [facility] Pgr: [phone] or if no answer [phone] 01/11/2018, 7:05 PM

## 2018-01-03 DEATH — deceased

## 2018-01-05 NOTE — Telephone Encounter (Signed)
01/01/18 [facility] [employer] called looking for D/C.for cremation called [facility] they said Dr.agarwala had not signed the one that [facility] sent over.Spoke to Dr.Agarwala to see why he had not signed it he said that he signed one that the Hospital made when the patient passed away. Does not want to sign another one till we find out where the original one went that he Signed. PWR    01/05/18 [facility] [employer] called again looking for this D/C said the family is wanting to know why the body has not been cremated yet. I called the morgue to see if the original was there, then called Hospital HIM Dept they don't have it they said to call Patient [facility]acement [phone]. Called them to see if they have seen it. THey don't have it either. We need to fax the signed D/C to Jae Dire at [facility] when we get it. Fax # [phone]. PWR.

## 2019-01-12 IMAGING — DX DG CHEST 1V PORT
1 series · 1 of 1 positions shown · non-contrast
Comparison: 12/24/2017

CLINICAL DATA: Respiratory failure. Pt was unresponsiverespiratory
failure

EXAM:
PORTABLE CHEST 1 VIEW

[chest]
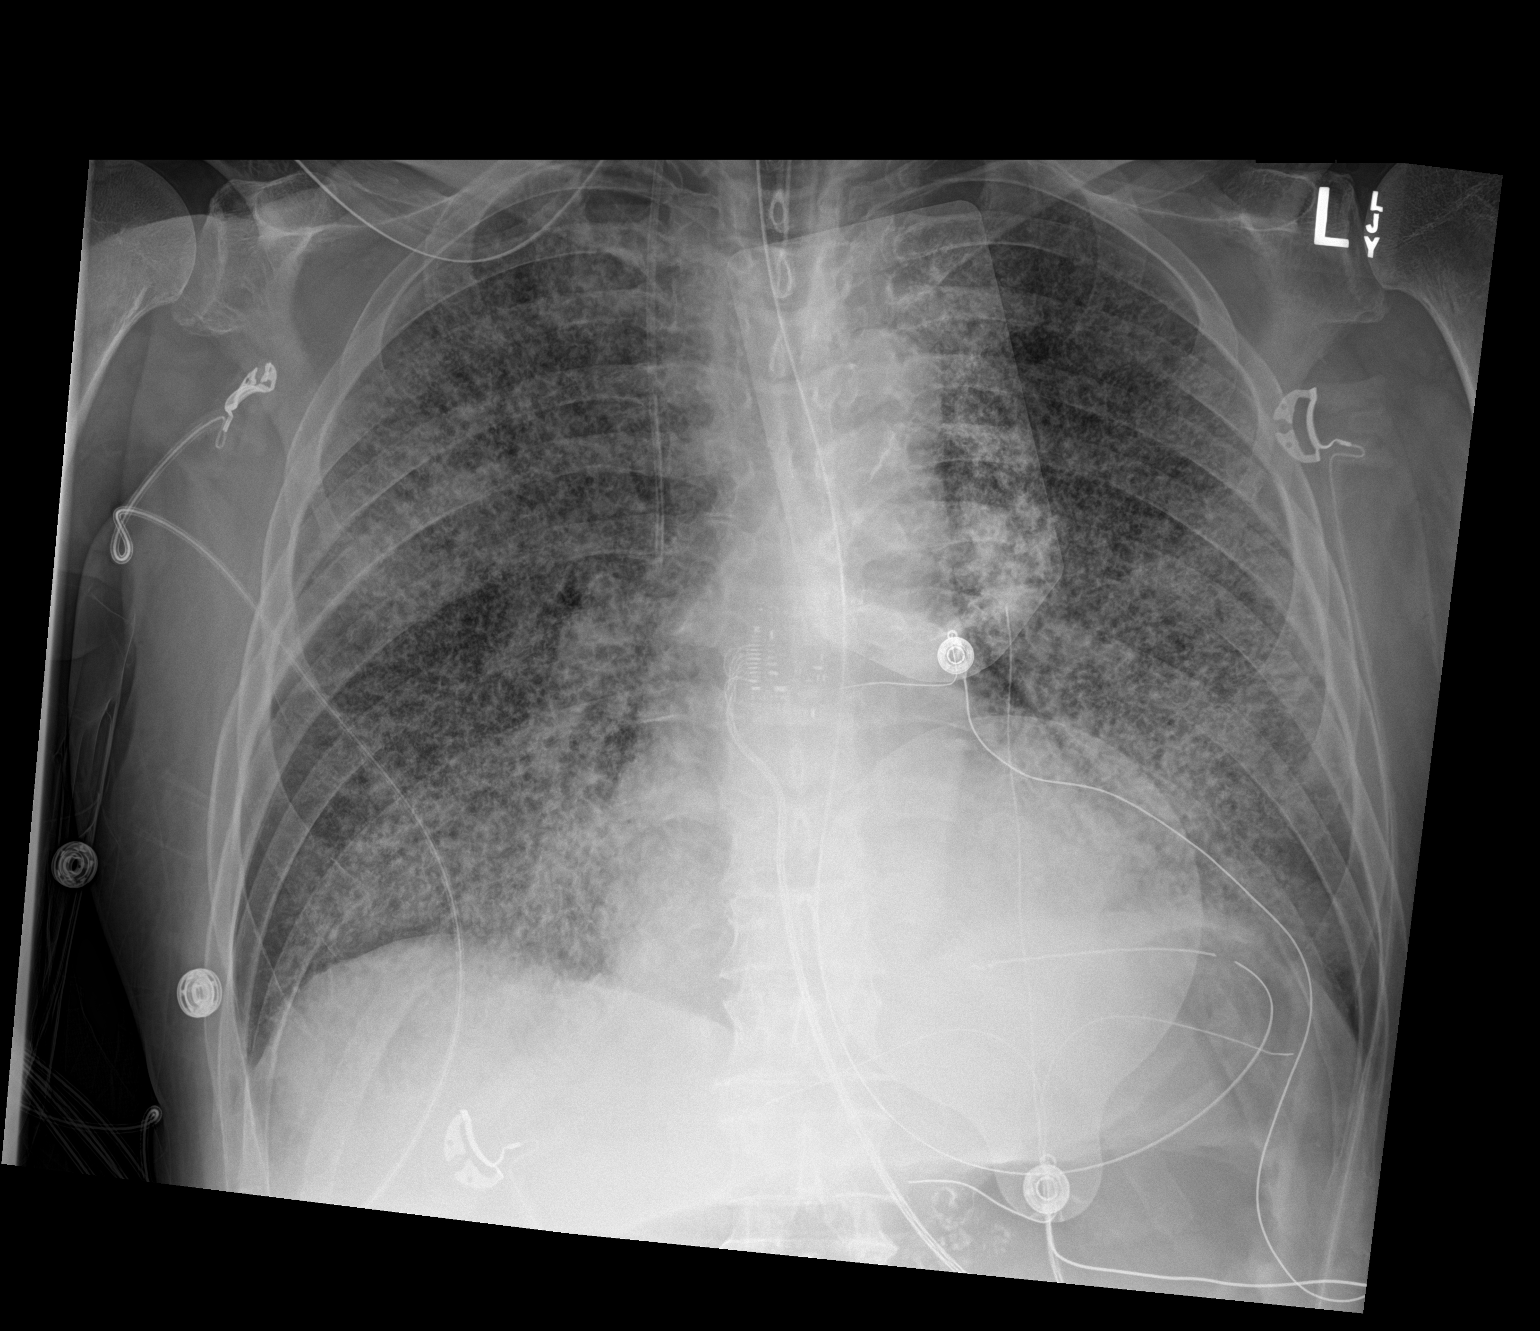

[1 of 1 positions shown; findings below may reference images not displayed]

FINDINGS: Endotracheal tube, NG tube, and central venous line unchanged.
Extensive financial opacities of slightly improved from comparison
exam. No pneumothorax.
IMPRESSION: 1. Stable support apparatus.
2. Improved interstitial/pulmonary edema pattern.
